# Patient Record
Sex: Female | Born: 1935 | Race: White | Hispanic: No | Smoking: Never smoker
Health system: Southern US, Community
[De-identification: ages and names within clinical notes are randomized; demographics above are authoritative.]

## PROBLEM LIST (undated history)

## (undated) DIAGNOSIS — G8929 Other chronic pain: Secondary | ICD-10-CM

## (undated) DIAGNOSIS — C519 Malignant neoplasm of vulva, unspecified: Secondary | ICD-10-CM

## (undated) DIAGNOSIS — G250 Essential tremor: Secondary | ICD-10-CM

## (undated) DIAGNOSIS — E785 Hyperlipidemia, unspecified: Secondary | ICD-10-CM

## (undated) DIAGNOSIS — M810 Age-related osteoporosis without current pathological fracture: Secondary | ICD-10-CM

## (undated) DIAGNOSIS — K219 Gastro-esophageal reflux disease without esophagitis: Secondary | ICD-10-CM

## (undated) DIAGNOSIS — M4850XA Collapsed vertebra, not elsewhere classified, site unspecified, initial encounter for fracture: Secondary | ICD-10-CM

## (undated) DIAGNOSIS — I82409 Acute embolism and thrombosis of unspecified deep veins of unspecified lower extremity: Secondary | ICD-10-CM

## (undated) DIAGNOSIS — M549 Dorsalgia, unspecified: Secondary | ICD-10-CM

## (undated) DIAGNOSIS — M1990 Unspecified osteoarthritis, unspecified site: Secondary | ICD-10-CM

## (undated) DIAGNOSIS — K922 Gastrointestinal hemorrhage, unspecified: Secondary | ICD-10-CM

## (undated) DIAGNOSIS — F0280 Dementia in other diseases classified elsewhere without behavioral disturbance: Secondary | ICD-10-CM

## (undated) DIAGNOSIS — I10 Essential (primary) hypertension: Secondary | ICD-10-CM

## (undated) DIAGNOSIS — F419 Anxiety disorder, unspecified: Secondary | ICD-10-CM

## (undated) DIAGNOSIS — J45909 Unspecified asthma, uncomplicated: Secondary | ICD-10-CM

## (undated) DIAGNOSIS — G309 Alzheimer's disease, unspecified: Secondary | ICD-10-CM

## (undated) DIAGNOSIS — N183 Chronic kidney disease, stage 3 (moderate): Secondary | ICD-10-CM

## (undated) HISTORY — DX: Unspecified asthma, uncomplicated: J45.909

## (undated) HISTORY — DX: Acute embolism and thrombosis of unspecified deep veins of unspecified lower extremity: I82.409

## (undated) HISTORY — DX: Alzheimer's disease, unspecified: G30.9

## (undated) HISTORY — DX: Age-related osteoporosis without current pathological fracture: M81.0

## (undated) HISTORY — PX: OTHER SURGICAL HISTORY: SHX169

## (undated) HISTORY — DX: Anxiety disorder, unspecified: F41.9

## (undated) HISTORY — DX: Unspecified osteoarthritis, unspecified site: M19.90

## (undated) HISTORY — DX: Gastro-esophageal reflux disease without esophagitis: K21.9

## (undated) HISTORY — DX: Dementia in other diseases classified elsewhere, unspecified severity, without behavioral disturbance, psychotic disturbance, mood disturbance, and anxiety: F02.80

## (undated) HISTORY — PX: NASAL SINUS SURGERY: SHX719

## (undated) HISTORY — DX: Collapsed vertebra, not elsewhere classified, site unspecified, initial encounter for fracture: M48.50XA

## (undated) HISTORY — DX: Malignant neoplasm of vulva, unspecified: C51.9

## (undated) HISTORY — DX: Chronic kidney disease, stage 3 (moderate): N18.3

## (undated) HISTORY — DX: Dorsalgia, unspecified: M54.9

## (undated) HISTORY — DX: Essential (primary) hypertension: I10

## (undated) HISTORY — DX: Essential tremor: G25.0

## (undated) HISTORY — DX: Hyperlipidemia, unspecified: E78.5

## (undated) HISTORY — DX: Other chronic pain: G89.29

## (undated) HISTORY — DX: Gastrointestinal hemorrhage, unspecified: K92.2

---

## 1965-08-08 HISTORY — PX: TOTAL ABDOMINAL HYSTERECTOMY W/ BILATERAL SALPINGOOPHORECTOMY: SHX83

## 1970-08-08 HISTORY — PX: OTHER SURGICAL HISTORY: SHX169

## 2002-08-08 HISTORY — PX: CATARACT EXTRACTION: SUR2

## 2007-09-03 LAB — PROTIME-INR

## 2007-10-05 ENCOUNTER — Ambulatory Visit: Admission: RE | Admit: 2007-10-05 | Discharge: 2007-10-05 | Payer: Self-pay | Admitting: Gynecology

## 2007-11-12 LAB — PROTIME-INR

## 2007-12-12 LAB — PROTIME-INR

## 2008-01-08 LAB — PROTIME-INR

## 2008-02-11 LAB — PROTIME-INR

## 2008-03-17 LAB — PROTIME-INR

## 2008-04-15 LAB — PROTIME-INR

## 2008-04-23 ENCOUNTER — Other Ambulatory Visit: Admission: RE | Admit: 2008-04-23 | Discharge: 2008-04-23 | Payer: Self-pay | Admitting: Gynecology

## 2008-04-23 ENCOUNTER — Ambulatory Visit: Admission: RE | Admit: 2008-04-23 | Discharge: 2008-04-23 | Payer: Self-pay | Admitting: Gynecology

## 2008-04-23 ENCOUNTER — Encounter: Payer: Self-pay | Admitting: Gynecology

## 2008-05-14 LAB — PROTIME-INR

## 2008-05-27 LAB — PROTIME-INR

## 2008-12-23 LAB — PROTIME-INR

## 2009-03-09 LAB — PROTIME-INR

## 2009-04-01 ENCOUNTER — Encounter (INDEPENDENT_AMBULATORY_CARE_PROVIDER_SITE_OTHER): Payer: Self-pay | Admitting: Gynecologic Oncology

## 2009-04-01 ENCOUNTER — Other Ambulatory Visit: Admission: RE | Admit: 2009-04-01 | Discharge: 2009-04-01 | Payer: Self-pay | Admitting: Gynecologic Oncology

## 2009-04-01 ENCOUNTER — Ambulatory Visit: Admission: RE | Admit: 2009-04-01 | Discharge: 2009-04-01 | Payer: Self-pay | Admitting: Gynecologic Oncology

## 2009-05-06 ENCOUNTER — Ambulatory Visit (HOSPITAL_BASED_OUTPATIENT_CLINIC_OR_DEPARTMENT_OTHER): Admission: RE | Admit: 2009-05-06 | Discharge: 2009-05-06 | Payer: Self-pay | Admitting: Gynecologic Oncology

## 2009-05-06 ENCOUNTER — Encounter (INDEPENDENT_AMBULATORY_CARE_PROVIDER_SITE_OTHER): Payer: Self-pay | Admitting: Gynecologic Oncology

## 2009-05-26 ENCOUNTER — Ambulatory Visit: Admission: RE | Admit: 2009-05-26 | Discharge: 2009-05-26 | Payer: Self-pay | Admitting: Gynecologic Oncology

## 2009-05-28 LAB — PROTIME-INR

## 2009-06-16 LAB — PROTIME-INR

## 2009-08-27 LAB — PROTIME-INR

## 2009-09-17 ENCOUNTER — Ambulatory Visit: Admission: RE | Admit: 2009-09-17 | Discharge: 2009-09-17 | Payer: Self-pay | Admitting: Gynecologic Oncology

## 2009-11-05 LAB — PROTIME-INR

## 2009-11-25 LAB — PROTIME-INR

## 2009-12-10 ENCOUNTER — Ambulatory Visit: Admission: RE | Admit: 2009-12-10 | Discharge: 2009-12-10 | Payer: Self-pay | Admitting: Gynecologic Oncology

## 2010-01-06 LAB — PROTIME-INR

## 2010-02-17 LAB — PROTIME-INR

## 2010-03-05 LAB — PROTIME-INR

## 2010-04-08 LAB — PROTIME-INR

## 2010-05-10 LAB — PROTIME-INR

## 2010-05-27 LAB — PROTIME-INR

## 2010-10-25 LAB — PROTIME-INR

## 2010-11-12 LAB — CBC
HCT: 45.4 % (ref 36.0–46.0)
MCHC: 33.9 g/dL (ref 30.0–36.0)
MCV: 93.7 fL (ref 78.0–100.0)
Platelets: 175 10*3/uL (ref 150–400)
RDW: 12.7 % (ref 11.5–15.5)

## 2010-11-12 LAB — PROTIME-INR: Prothrombin Time: 13.2 seconds (ref 11.6–15.2)

## 2010-11-12 LAB — DIFFERENTIAL
Basophils Absolute: 0.1 10*3/uL (ref 0.0–0.1)
Basophils Relative: 2 % — ABNORMAL HIGH (ref 0–1)
Eosinophils Absolute: 0.3 10*3/uL (ref 0.0–0.7)
Eosinophils Relative: 4 % (ref 0–5)
Lymphs Abs: 2.9 10*3/uL (ref 0.7–4.0)

## 2010-11-12 LAB — BASIC METABOLIC PANEL
BUN: 14 mg/dL (ref 6–23)
CO2: 30 mEq/L (ref 19–32)
Chloride: 101 mEq/L (ref 96–112)
Creatinine, Ser: 0.79 mg/dL (ref 0.4–1.2)
Glucose, Bld: 104 mg/dL — ABNORMAL HIGH (ref 70–99)
Potassium: 4.3 mEq/L (ref 3.5–5.1)

## 2010-11-29 LAB — PROTIME-INR: INR: 2.1 — AB (ref 0.9–1.1)

## 2010-12-21 NOTE — Consult Note (Signed)
Robin [name], Robin [name]             ACCOUNT NO.:  [id number]   MEDICAL RECORD NO.:  [id number]          PATIENT TYPE:  OUT   LOCATION:  GYN                          FACILITY:  [facility]   PHYSICIAN:  De Blanch, M.D.DATE OF BIRTH:  July 20, 1926   DATE OF CONSULTATION:  DATE OF DISCHARGE:                                 CONSULTATION   CHIEF COMPLAINT:  Vulvar cancer, left lower extremity edema.   INTERVAL HISTORY:  The patient returns today for continuing followup of  her vulvar cancer.  Since her last visit, she denies any problems with  her vulva.  She has no pelvic pain, pressure, vaginal bleeding or  discharge, GI or GU symptoms.  She has had mammograms recently.  These  were normal.   HISTORY OF PRESENT ILLNESS:  Stage II vulvar carcinoma, undergoing  initial surgery November 2008.  The procedure was complicated by a  postoperative lymphocyst and deep vein thrombosis.  The patient has been  on Coumadin for the last 9 months.   PAST MEDICAL HISTORY/ILLNESSES:  1. Hypertension.  2. Deep vein thrombosis.   CURRENT MEDICATIONS:  1. Lisinopril/hydrochlorothiazide.  2. Singulair.  3. Vytorin.  4. Coumadin 5.0 alternating with 2.5 mg daily.   DRUG ALLERGIES:  SULFA (rash.)   PAST SURGICAL HISTORY:  1. TAH-BSO for menorrhagia.  2. Appendectomy.  3. Hemithyroidectomy.  4. Breast biopsy.  5. Corneal implants.  6. Modified radical left vulvectomy and left inguinal lymphadenectomy.   OBSTETRICAL HISTORY:  Gravida 2.   SOCIAL HISTORY:  The patient is widowed.  She lives in assisted living  in her own apartment.  She comes accompanied by her daughter today.  She  does not smoke.   REVIEW OF SYSTEMS:  A 10-point comprehensive review of systems negative  except as noted above.   PHYSICAL EXAMINATION:  VITAL SIGNS:  Weight 124 pounds, blood pressure  139/58.  GENERAL:  The patient is a healthy, pleasant white female in no acute  distress.  HEENT:  Negative.  NECK:   Supple without thyromegaly.  BREASTS:  Without masses, discharge or skin changes.  She has a small  sebaceous cyst near the right nipple.  ABDOMEN:  Soft, nontender.  No mass, organomegaly, ascites or hernias  are noted.  Midline incision is well healed.  PELVIC:  EGBUS is normal.  No lesions are noted.  Vagina is clean,  slightly atrophic.  Again, no lesions are noted.  Bimanual rectovaginal  exam revealed no masses, induration or nodularity.  Cervix and uterus  are surgically absent.  LOWER EXTREMITIES:  The patient has 1+ ankle edema in the left ankle.   IMPRESSION:  1. Vulvar carcinoma November 2008.  No evidence of recurrent disease.      Pap smears were obtained.  2. Left lower extremity mild edema either secondary to lymphadenectomy      and/or deep vein thrombosis.  3. History of deep vein thrombosis.   The patient has a number of questions regarding continuing use of  Coumadin.  I indicated to her that in most cases, 6 months of Coumadin  would be the usual recommendation in  a patient who has been treated for,  but no longer has cancer.  She will discuss this with Dr. Shary Decamp.   FOLLOW UP:  She will return to see me in 6 months for continuing  followup.      De Blanch, M.D.  Electronically Signed     DC/MEDQ  D:  04/23/2008  T:  04/24/2008  Job:  [id number]   cc:   Feliciana Rossetti, MD  Fax: [phone]   Telford Nab, R.N.  [address]  [place], [place] [address]

## 2010-12-21 NOTE — Consult Note (Signed)
Robin [name]             ACCOUNT NO.:  [id number]   MEDICAL RECORD NO.:  [id number]          PATIENT TYPE:  OUT   LOCATION:  GYN                          FACILITY:  [facility]   PHYSICIAN:  De Blanch, M.D.DATE OF BIRTH:  1925/10/18   DATE OF CONSULTATION:  10/05/2007  DATE OF DISCHARGE:                                 CONSULTATION   CHIEF COMPLAINT:  Vulvar cancer.   HISTORY OF PRESENT ILLNESS:  A 77 year old white widowed female seen in  consultation at the request of Dr. Shary Decamp regarding management and  followup of a stage II vulvar carcinoma.   The patient was found to have a lesion on her left vulva in November  2008 which was biopsied and found to be an invasive well-differentiated  squamous cell carcinoma.  She underwent a modified left radical  vulvectomy and left inguinal lymphadenectomy on June 12, 2007.  Final  pathology showed no residual disease in the vulvar specimen and 9 lymph  nodes in the inguinal region which were free of any evidence of  metastatic disease.  The patient's postoperative course was complicated  by the development of a lymphocyst which was drained and an extensive  deep vein thrombosis involving the common iliac vein extending down to  the popliteal vein.  The patient is currently on Coumadin.   Currently, she denies any vulvar symptoms.  It is recognized that she  also has had lichen sclerosis, but does not have any itching or other  discomfort at the present time.   PAST MEDICAL HISTORY:  1. Hypertension.  2. Deep vein thrombosis.   CURRENT MEDICATIONS:  1. Lisinopril/hydrochlorothiazide.  2. Singulair.  3. Vytorin.  4. Coumadin.   ALLERGIES:  SULFA (rash).   PAST SURGICAL HISTORY:  1. Total abdominal surgery and bilateral salpingo-oophorectomy for      menorrhagia.  2. The patient thinks she had an appendectomy at the same time.  3. Hemithyroidectomy.  4. Breast biopsy.  5. Corneal implants.   OBSTETRICAL  HISTORY:  Gravida 2.   SOCIAL HISTORY:  The patient is widowed.  She lives in an assisted  living facility in her own apartment in [place].  She lives near her  adult daughter.  She does not smoke.   REVIEW OF SYSTEMS:  A 10-point comprehensive review of systems negative  except as noted above.   PHYSICAL EXAMINATION:  VITAL SIGNS:  Weight 125 pounds, height 5 feet 2  inches, blood pressure 110/80, pulse 80.  GENERAL:  The patient is a healthy elderly white female in no acute  distress.  HEENT:  Negative.  NECK:  Supple without thyromegaly.  There is no supraclavicular or  inguinal adenopathy in the left inguinal region.  EXTREMITIES:  There is minimal edema in the left thigh and none in the  left ankle and calf.  There is no evidence of a lymphocyst.  PELVIC/EGBUS:  Status post left hemivulvectomy, no lesions are noted.  She does have some white cutaneous changes on the right vulva consistent  with lichen sclerosis.  The vagina is atrophic.  Cervix and uterus are  surgically absent.  Bimanual  exam reveals no masses, induration or  nodularity.  Rectovaginal exam confirms.   IMPRESSION:  1. 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  The patient seems to be doing well with      no evidence of recurrent disease.  2. Deep vein thrombosis currently being managed on Coumadin by her      primary physician.   FOLLOW UP:  We will plan on seeing the patient back again in 6 months  for followup.      De Blanch, M.D.  Electronically Signed     DC/MEDQ  D:  10/05/2007  T:  10/06/2007  Job:  [id number]   cc:   Feliciana Rossetti, MD  Fax: [phone]   Telford Nab, R.N.  [address]  [place], [place] [address]

## 2010-12-21 NOTE — Consult Note (Signed)
[name], [name]             ACCOUNT NO.:  [id number]   MEDICAL RECORD NO.:  [id number]          PATIENT TYPE:  OUT   LOCATION:  GYN                          FACILITY:  [facility]   PHYSICIAN:  John T. Kyla Balzarine, M.D.    DATE OF BIRTH:  [birth year]   DATE OF CONSULTATION:  DATE OF DISCHARGE:                                 CONSULTATION   CHIEF COMPLAINT:  Follow-up of vulvar cancer with new vulvar lesion.   HISTORY OF PRESENT ILLNESS:  This patient had stage II vulvar cancer  treated in [place] in November, 2008.  She had postoperative lymphocyst  and DVT, treated on continuous Coumadin since.  She was last evaluated  here 1 year ago with negative examination and cytology.  In the interim,  she has urinary incontinence and over the summer has noted a new vulvar  pruritus and a new lesion in the right anterior vulva.  This is not  ulcerated, and she is concerned that this might represent recurrent  malignancy.  No new groin adenopathy.  Since she was last seen, she has  been diagnosed with some type of carotid obstruction, and amlodipine has  been added to her medical regimen.   PAST MEDICAL HISTORY:  Hypertension, postoperative DVT.  Prior TAH/BSO,  appendectomy, hemi-thyroidectomy, benign breast biopsy, corneal surgery,  and modified radical vulvectomy and left inguinal lymphadenectomy.  NSVD  x2.   MEDICATIONS:  Amlodipine, lisinopril and hydrochlorothiazide, Singulair,  Vytorin, Coumadin.   ALLERGIES:  SULFA drugs cause rash.   PERSONAL AND SOCIAL HISTORY:  The patient is widowed.  Nonsmoker.  Denies significant ethanol use.   FAMILY HISTORY:  Noncontributory.   REVIEW OF SYSTEMS:  A 10-point comprehensive review negative, except as  noted above.   PHYSICAL EXAMINATION:  Weight 124 pounds and blood pressure 124/86.  Vital signs otherwise stable and afebrile.  LUNGS:  Clear.  HEART:  Regular rate and rhythm with no JVD.  ABDOMEN:  Soft and benign with no hernia, ascites,  mass or tenderness.  EXTREMITIES:  No cords, Homans' or edema.  LYMPH SURVEY:  Negative for pathologic lymphadenopathy, including the  dissected left groin and the undissected right groin.  PELVIC:  Inspection of the vulva reveals a 2 cm area of erythema and  epithelial thickening involving the anterior right labia majora.  Vaginal mucosa is atrophic without lesions.  Bimanual and rectovaginal  examinations confirm absent uterus and cervix.  No mass or nodularity  and no tenderness.   PROCEDURE NOTE:  After informed consent obtained, the anterior vulvar  lesion was infiltrated with 1% Xylocaine and a punch biopsy obtained.  The operative site was treated with silver nitrate, and there were no  untoward side effects or bleeding.  It should be noted that an  appropriate time-out was observed prior to biopsy.   ASSESSMENT:  Vulvar cancer.  New vulvar lesion:  Differential diagnosis  by visual inspection includes chronic irritation with vulvar  hyperplasia, vulvar intraepithelial neoplasia; consider recurrent vulvar  cancer unlikely.   PLAN:  Disposition will depend upon results of biopsy.  This should be  communicated to her daughter,  Robin [name] at [phone].  If negative  for VIN, then the patient should be instructed in rinse/blot/blow dry  regimen again, and she should use clobetasol or 1% hydrocortisone cream.  The patient would need reversal of anticoagulation for resection if  there is any question of invasion, but could undergo with laser  vaporization of VIN III without reversal of her anticoagulation.      John T. Kyla Balzarine, M.D.  Electronically Signed     JTS/MEDQ  D:  04/01/2009  T:  04/01/2009  Job:  [id number]   cc:   Robin [name], R.N.  [address]  [place], [place] [address]   Robin Rossetti, MD  Fax: [phone]

## 2011-02-02 LAB — PROTIME-INR

## 2011-03-24 LAB — PROTIME-INR

## 2011-06-09 LAB — PROTIME-INR

## 2011-08-11 LAB — PROTIME-INR

## 2011-09-20 LAB — PROTIME-INR

## 2011-10-18 LAB — PROTIME-INR

## 2011-12-26 LAB — PROTIME-INR

## 2012-04-04 LAB — PROTIME-INR

## 2012-05-24 LAB — PROTIME-INR

## 2012-08-08 DIAGNOSIS — K922 Gastrointestinal hemorrhage, unspecified: Secondary | ICD-10-CM

## 2012-08-08 DIAGNOSIS — M4850XA Collapsed vertebra, not elsewhere classified, site unspecified, initial encounter for fracture: Secondary | ICD-10-CM

## 2012-08-08 HISTORY — DX: Gastrointestinal hemorrhage, unspecified: K92.2

## 2012-08-08 HISTORY — DX: Collapsed vertebra, not elsewhere classified, site unspecified, initial encounter for fracture: M48.50XA

## 2012-09-26 LAB — PROTIME-INR

## 2012-11-08 LAB — PROTIME-INR

## 2012-12-24 ENCOUNTER — Inpatient Hospital Stay: Payer: Self-pay | Admitting: Internal Medicine

## 2012-12-24 LAB — CBC
HGB: 14.1 g/dL (ref 12.0–16.0)
MCHC: 34.9 g/dL (ref 32.0–36.0)
MCV: 91 fL (ref 80–100)
Platelet: 452 10*3/uL — ABNORMAL HIGH (ref 150–440)
RDW: 12.9 % (ref 11.5–14.5)

## 2012-12-24 LAB — COMPREHENSIVE METABOLIC PANEL
Alkaline Phosphatase: 72 U/L (ref 50–136)
Anion Gap: 7 (ref 7–16)
Chloride: 95 mmol/L — ABNORMAL LOW (ref 98–107)
Co2: 28 mmol/L (ref 21–32)
Creatinine: 0.8 mg/dL (ref 0.60–1.30)
EGFR (African American): >60
Potassium: 3.6 mmol/L (ref 3.5–5.1)

## 2012-12-24 LAB — PROTIME-INR: INR: 1.9

## 2012-12-24 LAB — TROPONIN I: Troponin-I: <0.02 ng/mL

## 2012-12-25 LAB — CBC WITH DIFFERENTIAL/PLATELET
Basophil #: 0.1 10*3/uL (ref 0.0–0.1)
Basophil #: 0.1 10*3/uL (ref 0.0–0.1)
Basophil #: 0.1 10*3/uL (ref 0.0–0.1)
Basophil %: 0.7 %
Basophil %: 0.4 %
Basophil %: 0.5 %
Eosinophil #: 0.1 10*3/uL (ref 0.0–0.7)
Eosinophil #: 0.1 10*3/uL (ref 0.0–0.7)
Eosinophil %: 0.5 %
Eosinophil %: 1.2 %
HCT: 33.9 % — ABNORMAL LOW (ref 35.0–47.0)
HGB: 9.8 g/dL — ABNORMAL LOW (ref 12.0–16.0)
Lymphocyte #: 3.3 10*3/uL (ref 1.0–3.6)
Lymphocyte #: 2.3 10*3/uL (ref 1.0–3.6)
Lymphocyte #: 2.4 10*3/uL (ref 1.0–3.6)
Lymphocyte %: 15.2 %
MCH: 31.8 pg (ref 26.0–34.0)
MCH: 31.1 pg (ref 26.0–34.0)
MCHC: 33.9 g/dL (ref 32.0–36.0)
MCV: 92 fL (ref 80–100)
MCV: 92 fL (ref 80–100)
MCV: 92 fL (ref 80–100)
Monocyte #: 2.1 x10 3/mm — ABNORMAL HIGH (ref 0.2–0.9)
Monocyte #: 1.4 x10 3/mm — ABNORMAL HIGH (ref 0.2–0.9)
Monocyte #: 1.3 x10 3/mm — ABNORMAL HIGH (ref 0.2–0.9)
Monocyte %: 10.3 %
Monocyte %: 9.4 %
Neutrophil #: 11.1 10*3/uL — ABNORMAL HIGH (ref 1.4–6.5)
Neutrophil #: 9.2 10*3/uL — ABNORMAL HIGH (ref 1.4–6.5)
Neutrophil %: 74.5 %
Neutrophil %: 70.4 %
Platelet: 415 10*3/uL (ref 150–440)
Platelet: 265 10*3/uL (ref 150–440)
RBC: 3.39 10*6/uL — ABNORMAL LOW (ref 3.80–5.20)
RDW: 13 % (ref 11.5–14.5)
RDW: 12.9 % (ref 11.5–14.5)
WBC: 14.9 10*3/uL — ABNORMAL HIGH (ref 3.6–11.0)

## 2012-12-25 LAB — PROTIME-INR
INR: 2.3
Prothrombin Time: 24.4 secs — ABNORMAL HIGH (ref 11.5–14.7)

## 2012-12-26 LAB — BASIC METABOLIC PANEL
Calcium, Total: 8 mg/dL — ABNORMAL LOW (ref 8.5–10.1)
Creatinine: 0.61 mg/dL (ref 0.60–1.30)
EGFR (African American): >60
Osmolality: 269 (ref 275–301)
Potassium: 3.7 mmol/L (ref 3.5–5.1)

## 2012-12-26 LAB — CBC WITH DIFFERENTIAL/PLATELET
Basophil #: 0.1 10*3/uL (ref 0.0–0.1)
Eosinophil #: 0.2 10*3/uL (ref 0.0–0.7)
HGB: 10 g/dL — ABNORMAL LOW (ref 12.0–16.0)
Lymphocyte #: 2.1 10*3/uL (ref 1.0–3.6)
Lymphocyte %: 19.8 %
MCH: 31.7 pg (ref 26.0–34.0)
MCV: 91 fL (ref 80–100)
Monocyte #: 1.2 x10 3/mm — ABNORMAL HIGH (ref 0.2–0.9)
Monocyte %: 10.9 %
Neutrophil %: 66.8 %
RBC: 3.15 10*6/uL — ABNORMAL LOW (ref 3.80–5.20)
RDW: 12.8 % (ref 11.5–14.5)

## 2012-12-26 LAB — PROTIME-INR
INR: 1.5
Prothrombin Time: 17.9 secs — ABNORMAL HIGH (ref 11.5–14.7)

## 2012-12-27 LAB — CBC WITH DIFFERENTIAL/PLATELET
Basophil #: 0.1 10*3/uL (ref 0.0–0.1)
Basophil %: 0.6 %
Eosinophil #: 0 10*3/uL (ref 0.0–0.7)
Eosinophil %: 0.1 %
Lymphocyte #: 1.2 10*3/uL (ref 1.0–3.6)
Lymphocyte %: 11.8 %
MCHC: 34.7 g/dL (ref 32.0–36.0)
MCV: 90 fL (ref 80–100)
Monocyte #: 0.6 x10 3/mm (ref 0.2–0.9)
Monocyte %: 5.7 %
Neutrophil #: 8.4 10*3/uL — ABNORMAL HIGH (ref 1.4–6.5)
Neutrophil %: 81.8 %
Platelet: 287 10*3/uL (ref 150–440)
RBC: 3.19 10*6/uL — ABNORMAL LOW (ref 3.80–5.20)
RDW: 12.4 % (ref 11.5–14.5)
WBC: 10.2 10*3/uL (ref 3.6–11.0)

## 2012-12-27 LAB — PROTIME-INR: INR: 1.3

## 2012-12-28 ENCOUNTER — Other Ambulatory Visit: Payer: Self-pay | Admitting: *Deleted

## 2012-12-28 MED ORDER — OXYCODONE-ACETAMINOPHEN 5-325 MG PO TABS: ORAL_TABLET | ORAL | Status: DC

## 2012-12-28 MED ORDER — ALPRAZOLAM 0.25 MG PO TABS: ORAL_TABLET | ORAL | Status: DC

## 2012-12-29 LAB — CULTURE, BLOOD (SINGLE)

## 2013-01-07 ENCOUNTER — Encounter: Payer: Self-pay | Admitting: Internal Medicine

## 2013-01-07 ENCOUNTER — Non-Acute Institutional Stay (SKILLED_NURSING_FACILITY): Payer: Medicare Other | Admitting: Internal Medicine

## 2013-01-07 DIAGNOSIS — K209 Esophagitis, unspecified without bleeding: Secondary | ICD-10-CM

## 2013-01-07 DIAGNOSIS — IMO0002 Reserved for concepts with insufficient information to code with codable children: Secondary | ICD-10-CM

## 2013-01-07 DIAGNOSIS — G8929 Other chronic pain: Secondary | ICD-10-CM

## 2013-01-07 DIAGNOSIS — F411 Generalized anxiety disorder: Secondary | ICD-10-CM

## 2013-01-07 DIAGNOSIS — J4520 Mild intermittent asthma, uncomplicated: Secondary | ICD-10-CM

## 2013-01-07 DIAGNOSIS — J45909 Unspecified asthma, uncomplicated: Secondary | ICD-10-CM

## 2013-01-07 DIAGNOSIS — M549 Dorsalgia, unspecified: Secondary | ICD-10-CM

## 2013-01-07 NOTE — Progress Notes (Signed)
Patient ID: Robin [name], female   DOB: [birth year], 79 y.o.   MRN: [id number]   Code Status: DNR  Allergies  Allergen Reactions  . Latex   . Sulfa Antibiotics     Chief Complaint: New admit, post hospitalization 12/24/12- 12/29/12  HPI:  79 y/o female patient with hx of GERD and DVT among others was admitted with melena. She was on chronic prednsione and coumadin for her dvt. Her hb had dropped from 14.1 to 11.8 on admission. Ct abdomen/pelvis was done and showed no acute intraabdominal process.she was started on protonix drip and flagyl and GI was consulted. She underwent EGD which showed severe esophagitis. She was switched to oral ppi bid. Her hb remained stable. Her bp remained low in the hospital and her bp meds were held. She was also noted to have complression fracture of her spine  And has chronic back pain. Therapy team saw her and recommended SNF for short term rehab. She was seen in her room today.pleasant elderly female patient in no acute distress and denies any complaints. She has been working with therapy and feels she is getting stronger  Review of Systems  Constitutional: Negative for fever, chills, malaise/fatigue and diaphoresis.  HENT: Negative for congestion.   Eyes: Negative for blurred vision.  Respiratory: Negative for cough and shortness of breath.   Cardiovascular: Negative for chest pain and palpitations.  Gastrointestinal: Positive for heartburn. Negative for nausea, vomiting, abdominal pain and blood in stool.  Genitourinary: Negative for dysuria.  Musculoskeletal: Positive for back pain. Negative for myalgias.  Skin: Negative for rash.  Neurological: Negative for dizziness, tremors, weakness and headaches.  Psychiatric/Behavioral: Negative for depression.    Past Medical History  Diagnosis Date  . Asthma   . GERD (gastroesophageal reflux disease)   . DVT (deep venous thrombosis)   . Osteoporosis   . Vulvar cancer   . Chronic back pain    Past  Surgical History  Procedure Laterality Date  . Abdominal hysterectomy    . Thyroid surgery     Social History:   reports that she has never smoked. She does not have any smokeless tobacco history on file. She reports that she does not drink alcohol or use illicit drugs.  Family History  Problem Relation Age of Onset  . Heart disease Mother     chf  . Cancer Father     colon cancer  . Cancer Brother     renal cancer    Medications: Patient's Medications  New Prescriptions   No medications on file  Previous Medications   ACETAMINOPHEN (TYLENOL) 500 MG TABLET    Take 500 mg by mouth daily as needed for pain or fever.   ALPRAZOLAM (XANAX) 0.25 MG TABLET    Take 1 tablet every 8 hours as needed for anxiety   CALCIUM CARBONATE-VITAMIN D (CALCIUM-VITAMIN D) 600-200 MG-UNIT CAPS    Take 1 capsule by mouth 3 (three) times daily.   MONTELUKAST (SINGULAIR) 10 MG TABLET    Take 10 mg by mouth at bedtime.   OXYCODONE-ACETAMINOPHEN (PERCOCET/ROXICET) 5-325 MG PER TABLET    Take one tablet  Every 6 hours as needed for pain   PANTOPRAZOLE (PROTONIX) 40 MG TABLET    Take 40 mg by mouth 2 (two) times daily.  Modified Medications   No medications on file  Discontinued Medications   No medications on file    Physical Exam:  Filed Vitals:   01/07/13 1259  BP: 117/67  Pulse: 84  Temp: 98.2 F (36.8 C)  Resp: 16  Height: 5\' 5"  (1.651 m)  Weight: 143 lb (64.864 kg)  SpO2: 96%   gen- elderly patient , pleasant, in no acute distress heent- no pallor or icterus, no LAD, MMM cvs- normal s1, s2, rrr respi- b/l cta, no wheeze or rhonchi abdo- bowel sounds present, soft, non tender, no abdominal wall tenderness Ext- able to move all 4, adequate strength Neuro- aaox 3, non focal  Labs reviewed: Facility lab pending  Assessment/Plan  Esophagitis- continue protonix 40 mg bid, no melena or blood in stool reported.  Tolerating feed well.   Generalized weakness- in setting of recent bleed.  Working well with therapy team. Once has steady gait and adequate strength, will be able to return to her familiar setting  Asthma- currently stable on montelukast , monitor clinically.   Anxiety- symptoms under control with xanax   Osteoporosis- on ca-vit d, fall precautions  Chronic back pain- stable on current pain regimen and ca-vit d supplement, fall precautions. Monitor clinically  Of note- on reviewof discharge packet, pt has been on prednisone 5 mg daily chronically. Pt unable to provide a reason. Will continue her home course of prednisone for now but given her esophagitis and osteoporosis hisotry, long term steroid use can worsen them. Will have pcp re-evaulate need for prednisone.   Family/ staff Communication: reviewed care plan with patient and nursing supervisor   Goals of care: completion of STR and return home   Labs/tests ordered- cbc, cmp

## 2013-01-10 DIAGNOSIS — J45909 Unspecified asthma, uncomplicated: Secondary | ICD-10-CM | POA: Insufficient documentation

## 2013-01-10 DIAGNOSIS — IMO0002 Reserved for concepts with insufficient information to code with codable children: Secondary | ICD-10-CM | POA: Insufficient documentation

## 2013-01-10 DIAGNOSIS — F411 Generalized anxiety disorder: Secondary | ICD-10-CM | POA: Insufficient documentation

## 2013-01-10 DIAGNOSIS — M549 Dorsalgia, unspecified: Secondary | ICD-10-CM | POA: Insufficient documentation

## 2013-01-23 ENCOUNTER — Non-Acute Institutional Stay (SKILLED_NURSING_FACILITY): Payer: Medicare Other | Admitting: Nurse Practitioner

## 2013-01-23 ENCOUNTER — Encounter: Payer: Self-pay | Admitting: Nurse Practitioner

## 2013-01-23 DIAGNOSIS — IMO0002 Reserved for concepts with insufficient information to code with codable children: Secondary | ICD-10-CM

## 2013-01-23 DIAGNOSIS — R5381 Other malaise: Secondary | ICD-10-CM

## 2013-01-23 MED ORDER — VITAMIN D 50 MCG (2000 UT) PO TABS: 2000.0000 [IU] | ORAL_TABLET | Freq: Every day | ORAL | Status: DC

## 2013-01-23 NOTE — Progress Notes (Signed)
  Subjective:    Patient ID: Robin [name], female    DOB: 12-23-1925, 79 y.o.   MRN: [id number]  HPI Comments: Pt was seen today for discharge from snf.     Review of Systems  All other systems reviewed and are negative.       Objective:   Physical Exam  Vitals reviewed. Constitutional: She appears well-developed and well-nourished.  Eyes: Pupils are equal, round, and reactive to light.  Cardiovascular: Normal rate and regular rhythm.   Pulmonary/Chest: Effort normal and breath sounds normal.  Abdominal: Soft.  Neurological: She is alert.  Skin: Skin is warm and dry.  Psychiatric: She has a normal mood and affect.          Assessment & Plan:  Discharge home with home health pt/ot; home safety eval. DME: rollator. Will d/c Percocet due to non-use, she can continue to use Tylenol prn. RX's written and given to pt. Daughter says pt does not need any of the other meds after review. 1) Xanax 0.25mg  one po q 8 hrs prn #90 2) Singulair 10mg  po qpm #30 3) Vitamin d3 2000 u po qd #30 Pt is to make appointment with pcp in 1-2 weeks. She is to follow up with ortho as directed.

## 2013-01-27 DIAGNOSIS — Z5189 Encounter for other specified aftercare: Secondary | ICD-10-CM

## 2013-01-27 DIAGNOSIS — J45909 Unspecified asthma, uncomplicated: Secondary | ICD-10-CM

## 2013-01-27 DIAGNOSIS — M6281 Muscle weakness (generalized): Secondary | ICD-10-CM

## 2013-01-27 DIAGNOSIS — R262 Difficulty in walking, not elsewhere classified: Secondary | ICD-10-CM

## 2013-02-21 ENCOUNTER — Ambulatory Visit: Payer: Self-pay | Admitting: Gastroenterology

## 2013-03-27 ENCOUNTER — Encounter: Payer: Self-pay | Admitting: Family Medicine

## 2013-03-27 ENCOUNTER — Telehealth: Payer: Self-pay

## 2013-03-27 ENCOUNTER — Ambulatory Visit (INDEPENDENT_AMBULATORY_CARE_PROVIDER_SITE_OTHER): Payer: Medicare Other | Admitting: Family Medicine

## 2013-03-27 VITALS — BP 130/70 | HR 84 | Temp 98.2°F | Ht 59.5 in | Wt 120.0 lb

## 2013-03-27 DIAGNOSIS — F419 Anxiety disorder, unspecified: Secondary | ICD-10-CM

## 2013-03-27 DIAGNOSIS — N390 Urinary tract infection, site not specified: Secondary | ICD-10-CM

## 2013-03-27 DIAGNOSIS — E785 Hyperlipidemia, unspecified: Secondary | ICD-10-CM

## 2013-03-27 DIAGNOSIS — C519 Malignant neoplasm of vulva, unspecified: Secondary | ICD-10-CM

## 2013-03-27 DIAGNOSIS — F0280 Dementia in other diseases classified elsewhere without behavioral disturbance: Secondary | ICD-10-CM

## 2013-03-27 DIAGNOSIS — F411 Generalized anxiety disorder: Secondary | ICD-10-CM

## 2013-03-27 DIAGNOSIS — M8448XD Pathological fracture, other site, subsequent encounter for fracture with routine healing: Secondary | ICD-10-CM

## 2013-03-27 DIAGNOSIS — K922 Gastrointestinal hemorrhage, unspecified: Secondary | ICD-10-CM

## 2013-03-27 DIAGNOSIS — M1990 Unspecified osteoarthritis, unspecified site: Secondary | ICD-10-CM

## 2013-03-27 LAB — POCT URINALYSIS DIPSTICK
Glucose, UA: NEGATIVE
Nitrite, UA: NEGATIVE
Urobilinogen, UA: 0.2

## 2013-03-27 MED ORDER — PREDNISONE 5 MG PO TABS: 5.0000 mg | ORAL_TABLET | Freq: Every day | ORAL | Status: DC

## 2013-03-27 MED ORDER — FLUOXETINE HCL 10 MG PO CAPS: 10.0000 mg | ORAL_CAPSULE | Freq: Every day | ORAL | Status: DC

## 2013-03-27 MED ORDER — PANTOPRAZOLE SODIUM 40 MG PO TBEC: 40.0000 mg | DELAYED_RELEASE_TABLET | Freq: Two times a day (BID) | ORAL | Status: DC

## 2013-03-27 MED ORDER — VITAMIN D 50 MCG (2000 UT) PO TABS: 2000.0000 [IU] | ORAL_TABLET | Freq: Every day | ORAL | Status: DC

## 2013-03-27 MED ORDER — MONTELUKAST SODIUM 10 MG PO TABS: 10.0000 mg | ORAL_TABLET | Freq: Every day | ORAL | Status: DC

## 2013-03-27 NOTE — Patient Instructions (Addendum)
F/u 4 months 

## 2013-03-27 NOTE — Progress Notes (Signed)
Nature conservation officer at [facility] [address] [place] [address] Phone: [phone] Fax: [phone]  Date:  03/27/2013   Name:  Robin [name]   DOB:  Oct 13, 1925   MRN:  [id number] Gender: female Age: 79 y.o.  Primary Physician:  No primary provider on file.  Evaluating MD: Hannah Beat, MD   Chief Complaint: Establish Care   History of Present Illness:  Robin [name] is a 79 y.o. pleasant patient who presents with the following:  New patient: Complex 80 year old patient recently discharged from hospital at [facility] with GI bleed while on Coumadin, s/p 2 endoscopies. Lives with her daughter, and a significant history of vulvar cancer as well.  DVT 2 weeks out from original surgery, then placed for coumadin. Bleeding internally, then placed on Coumaddin. Daughter [place] [address]. After they stopped her coumadin given gi bleed risk and her recent falls and dementia.  Now off BP meds and severe OA, osteoporosis. (5 mg pred x 3 years) Bleeding --- was on protonix 40 mg. Currently she has having some significant reflux.  On pred chronically.  Sometimes she has quite a bit of anxiety and has been taking some anxiety intermittently.  She also has some early alzheimer's disease.  Patient Active Problem List   Diagnosis Date Noted  . Vulvar cancer   . Osteoarthritis   . Hyperlipidemia   . Anxiety   . Alzheimers disease   . Compression fracture of spine   . GI bleed   . Chronic steroid use 01/10/2013  . Asthma, chronic 01/10/2013  . Chronic back pain 01/10/2013  . Anxiety state, unspecified 01/10/2013  . Acute esophagitis 01/10/2013    Past Medical History  Diagnosis Date  . Asthma   . GERD (gastroesophageal reflux disease)   . DVT (deep venous thrombosis)   . Osteoporosis   . Vulvar cancer   . Chronic back pain   . Osteoarthritis   . Hypertension   . Hyperlipidemia   . Anxiety   . Alzheimers disease     mild  . Compression  fracture of spine 2014    x3  . GI bleed 2014    ICU stay at [facility] x 4 days    Past Surgical History  Procedure Laterality Date  . Abdominal hysterectomy  1967    [place]  . Thyroid surgery  1972    removed half, [place]  . Nasal sinus surgery  1977.,79 ,97 and 98    removed polyps, [place]  . Cataract extraction  2004  . Vulvur cancer and lymph nodes removed  2008 and 2010    2nd Cancer surgery at [facility]    History   Social History  . Marital Status: Widowed    Spouse Name: N/A    Number of Children: N/A  . Years of Education: 12   Occupational History  . retired    Social History Main Topics  . Smoking status: Never Smoker   . Smokeless tobacco: Never Used  . Alcohol Use: No  . Drug Use: No  . Sexual Activity: Not Currently   Other Topics Concern  . Not on file   Social History Narrative   Had been living in [place]   Living with daughter in [place] now    Family History  Problem Relation Age of Onset  . Heart disease Mother     chf  . Cancer Father     colon cancer  . Cancer Brother  renal cancer    Allergies  Allergen Reactions  . Latex   . Sulfa Antibiotics     Current Outpatient Prescriptions on File Prior to Visit  Medication Sig Dispense Refill  . acetaminophen (TYLENOL) 500 MG tablet Take 500 mg by mouth daily as needed for pain or fever.      . Calcium Carbonate-Vitamin D (CALCIUM-VITAMIN D) 600-200 MG-UNIT CAPS Take 1 capsule by mouth 3 (three) times daily.       No current facility-administered medications on file prior to visit.     Review of Systems:  Gi bleed as above. No cp, no lightheadedness. She has had some dark urine, but had a normal urological work-up this year, including cystoscopy. Asthma is stable.  Otherwise, the pertinent positives and negatives are listed above and in the HPI, otherwise a full review of systems has been reviewed and is negative unless noted positive.   Physical Examination: BP 130/70   Pulse 84  Temp(Src) 98.2 F (36.8 C) (Oral)  Ht 4' 11.5" (1.511 m)  Wt 120 lb (54.432 kg)  BMI 23.84 kg/m2  Ideal Body Weight: Weight in (lb) to have BMI = 25: 125.6   GEN: WDWN, NAD, Non-toxic, A & O x 3 HEENT: Atraumatic, Normocephalic. Neck supple. No masses, No LAD. Ears and Nose: No external deformity. CV: RRR, No M/G/R. No JVD. No thrill. No extra heart sounds. PULM: CTA B, no wheezes, crackles, rhonchi. No retractions. No resp. distress. No accessory muscle use. EXTR: No c/c/e NEURO Normal gait.  PSYCH: Normally interactive. Conversant. Not depressed or anxious appearing.  Calm demeanor.    Assessment and Plan:  UTI (urinary tract infection) - Plan: Urine Microscopic, POCT urinalysis dipstick  Vulvar cancer  Osteoarthritis  Hyperlipidemia  Anxiety  Alzheimers disease  Compression fracture of spine, with routine healing, subsequent encounter  GI bleed  >45 minutes spent in face to face time with patient, >50% spent in counselling or coordination of care: long conversation with patient and daughter going over history, review of records. I think staying off coumadin is wise, as she is falling, almost died from a GI bleed, and has dementia.  I am also going to try to very slowly taper her off of prednisone over about 6 months. Potential risks would seem to outweigh risks.  Results for orders placed in visit on 03/27/13  URINALYSIS, MICROSCOPIC ONLY      Result Value Range   Squamous Epithelial / LPF NONE SEEN  RARE   Crystals NONE SEEN  NONE SEEN   Casts NONE SEEN  NONE SEEN   WBC, UA 0-2  <3 WBC/hpf   RBC / HPF 0-2  <3 RBC/hpf   Bacteria, UA NONE SEEN  RARE  POCT URINALYSIS DIPSTICK      Result Value Range   Color, UA yellow     Clarity, UA cloudy     Glucose, UA negative     Bilirubin, UA negative     Ketones, UA negative     Spec Grav, UA 1.010     Blood, UA moderate     pH, UA 7.5     Protein, UA trace     Urobilinogen, UA 0.2     Nitrite, UA  negative     Leukocytes, UA Trace       Orders Today:  Orders Placed This Encounter  Procedures  . Urine Microscopic  . POCT urinalysis dipstick    Updated Medication List: (Includes new medications, updates to list, dose adjustments)  Meds ordered this encounter  Medications  . DISCONTD: prednisoLONE 5 MG TABS tablet    Sig: Take by mouth daily.  . Cholecalciferol (VITAMIN D) 2000 UNITS tablet    Sig: Take 1 tablet (2,000 Units total) by mouth daily.    Dispense:  30 tablet    Refill:  11  . pantoprazole (PROTONIX) 40 MG tablet    Sig: Take 1 tablet (40 mg total) by mouth 2 (two) times daily.    Dispense:  30 tablet    Refill:  11  . montelukast (SINGULAIR) 10 MG tablet    Sig: Take 1 tablet (10 mg total) by mouth at bedtime.    Dispense:  30 tablet    Refill:  11  . predniSONE (DELTASONE) 5 MG tablet    Sig: Take 1 tablet (5 mg total) by mouth daily.    Dispense:  30 tablet    Refill:  5  . FLUoxetine (PROZAC) 10 MG capsule    Sig: Take 1 capsule (10 mg total) by mouth daily.    Dispense:  30 capsule    Refill:  5    Medications Discontinued: Medications Discontinued During This Encounter  Medication Reason  . ALPRAZolam (XANAX) 0.25 MG tablet   . prednisoLONE 5 MG TABS tablet   . Cholecalciferol (VITAMIN D) 2000 UNITS tablet Reorder  . pantoprazole (PROTONIX) 40 MG tablet Reorder  . montelukast (SINGULAIR) 10 MG tablet Reorder      Signed, Karleen Hampshire T. Gurdeep Keesey, MD 03/27/2013 2:22 PM

## 2013-03-27 NOTE — Telephone Encounter (Signed)
Heidi with [facility] left v/m; prednisone 5 mg taking one tab daily [facility]; pts family request clarification of instructions; family thought pt was to wein off of prednisone and the dosage was to be decreased.Please advise.

## 2013-03-28 LAB — URINALYSIS, MICROSCOPIC ONLY
Bacteria, UA: NONE SEEN
Crystals: NONE SEEN

## 2013-03-28 NOTE — Telephone Encounter (Signed)
Advised Heidi at [facility] of directions.

## 2013-03-28 NOTE — Telephone Encounter (Signed)
Decrease down to 4 mg daily. 1 mg tablets. 4 tabs po daily. #120. 0 refills   On the next refill: 1 mg tablets, 3 tablets po daily. #90, 0 refills

## 2013-03-31 ENCOUNTER — Encounter: Payer: Self-pay | Admitting: Family Medicine

## 2013-03-31 DIAGNOSIS — K922 Gastrointestinal hemorrhage, unspecified: Secondary | ICD-10-CM | POA: Insufficient documentation

## 2013-03-31 DIAGNOSIS — F419 Anxiety disorder, unspecified: Secondary | ICD-10-CM | POA: Insufficient documentation

## 2013-03-31 DIAGNOSIS — F0280 Dementia in other diseases classified elsewhere without behavioral disturbance: Secondary | ICD-10-CM | POA: Insufficient documentation

## 2013-03-31 DIAGNOSIS — M1990 Unspecified osteoarthritis, unspecified site: Secondary | ICD-10-CM | POA: Insufficient documentation

## 2013-03-31 DIAGNOSIS — M4850XA Collapsed vertebra, not elsewhere classified, site unspecified, initial encounter for fracture: Secondary | ICD-10-CM | POA: Insufficient documentation

## 2013-03-31 DIAGNOSIS — E785 Hyperlipidemia, unspecified: Secondary | ICD-10-CM | POA: Insufficient documentation

## 2013-03-31 DIAGNOSIS — C519 Malignant neoplasm of vulva, unspecified: Secondary | ICD-10-CM | POA: Insufficient documentation

## 2013-04-01 ENCOUNTER — Other Ambulatory Visit: Payer: Self-pay | Admitting: Family Medicine

## 2013-04-01 NOTE — Telephone Encounter (Signed)
Pt's daughter called stating that pt was switched from xanax to prozac last week and today she is crying, shaking, and panicking.  She would like to know if pt can switch back to xanax.

## 2013-04-01 NOTE — Telephone Encounter (Signed)
She can still take her xanax tid prn --- it sounds like she needs them now, and it will take a month or so for the prozac to work

## 2013-04-01 NOTE — Telephone Encounter (Signed)
Please send in #60, 2 refills

## 2013-04-01 NOTE — Telephone Encounter (Signed)
Patient's daughter notified as instructed by telephone. Was advised that she is almost out of the Xanax and would like [facility]

## 2013-04-02 MED ORDER — ALPRAZOLAM 0.25 MG PO TABS: 0.2500 mg | ORAL_TABLET | Freq: Three times a day (TID) | ORAL | Status: DC | PRN

## 2013-04-02 NOTE — Telephone Encounter (Signed)
Rx called to pharmacy

## 2013-05-13 ENCOUNTER — Telehealth: Payer: Self-pay | Admitting: *Deleted

## 2013-05-13 NOTE — Telephone Encounter (Signed)
[place] Sink notified as instructed by telephone.  She is in agreement with plan.

## 2013-05-13 NOTE — Telephone Encounter (Signed)
Received a voicemail from [place] stating her mom has been on steroids 5 mg x 2 years for osteoarthritis prescribed by another physician.  States Dr. Patsy Lager has been trying to ween patient off the steroids and had gotten her down to 3 mg daily but her pain is beginning to come back.  [place] Sink is asking if Dr. Patsy Lager will keep her mom on 3 mg daily and even considering increasing it back to 4 mg daily.  Will forward to Dr. Patsy Lager for review.  Call back number [phone].

## 2013-05-13 NOTE — Telephone Encounter (Signed)
We can keep her on 3 mg of prednisone. I would keep that up for at least 1-2 months, but if she is very uncomfortable, then we can put her on 4 mg.   Hannah Beat, MD 05/13/2013, 11:25 AM

## 2013-05-20 ENCOUNTER — Other Ambulatory Visit: Payer: Self-pay | Admitting: Family Medicine

## 2013-06-23 ENCOUNTER — Other Ambulatory Visit: Payer: Self-pay | Admitting: Family Medicine

## 2013-06-23 NOTE — Telephone Encounter (Signed)
Last office visit 03/27/2013.  Ok to refill? 

## 2013-07-29 ENCOUNTER — Encounter: Payer: Self-pay | Admitting: Family Medicine

## 2013-07-29 ENCOUNTER — Ambulatory Visit (INDEPENDENT_AMBULATORY_CARE_PROVIDER_SITE_OTHER): Payer: Medicare Other | Admitting: Family Medicine

## 2013-07-29 VITALS — BP 176/102 | HR 72 | Temp 97.8°F | Ht 59.5 in | Wt 122.8 lb

## 2013-07-29 DIAGNOSIS — F0280 Dementia in other diseases classified elsewhere without behavioral disturbance: Secondary | ICD-10-CM

## 2013-07-29 DIAGNOSIS — I10 Essential (primary) hypertension: Secondary | ICD-10-CM

## 2013-07-29 DIAGNOSIS — E785 Hyperlipidemia, unspecified: Secondary | ICD-10-CM

## 2013-07-29 DIAGNOSIS — K922 Gastrointestinal hemorrhage, unspecified: Secondary | ICD-10-CM

## 2013-07-29 DIAGNOSIS — Z79899 Other long term (current) drug therapy: Secondary | ICD-10-CM

## 2013-07-29 DIAGNOSIS — C519 Malignant neoplasm of vulva, unspecified: Secondary | ICD-10-CM

## 2013-07-29 DIAGNOSIS — F411 Generalized anxiety disorder: Secondary | ICD-10-CM

## 2013-07-29 DIAGNOSIS — IMO0002 Reserved for concepts with insufficient information to code with codable children: Secondary | ICD-10-CM

## 2013-07-29 DIAGNOSIS — N183 Chronic kidney disease, stage 3 unspecified: Secondary | ICD-10-CM

## 2013-07-29 DIAGNOSIS — G8929 Other chronic pain: Secondary | ICD-10-CM

## 2013-07-29 DIAGNOSIS — M549 Dorsalgia, unspecified: Secondary | ICD-10-CM

## 2013-07-29 HISTORY — DX: Chronic kidney disease, stage 3 unspecified: N18.30

## 2013-07-29 LAB — HEPATIC FUNCTION PANEL
Albumin: 3.9 g/dL (ref 3.5–5.2)
Alkaline Phosphatase: 76 U/L (ref 39–117)
Total Protein: 7 g/dL (ref 6.0–8.3)

## 2013-07-29 LAB — CBC WITH DIFFERENTIAL/PLATELET
Basophils Absolute: 0 10*3/uL (ref 0.0–0.1)
Basophils Relative: 0.5 % (ref 0.0–3.0)
Eosinophils Absolute: 0.1 10*3/uL (ref 0.0–0.7)
HCT: 42.3 % (ref 36.0–46.0)
Hemoglobin: 13.9 g/dL (ref 12.0–15.0)
Lymphocytes Relative: 29 % (ref 12.0–46.0)
Lymphs Abs: 2.7 10*3/uL (ref 0.7–4.0)
MCHC: 32.8 g/dL (ref 30.0–36.0)
MCV: 85.5 fl (ref 78.0–100.0)
Monocytes Absolute: 0.8 10*3/uL (ref 0.1–1.0)
Neutro Abs: 5.7 10*3/uL (ref 1.4–7.7)
RBC: 4.94 Mil/uL (ref 3.87–5.11)
RDW: 14.9 % — ABNORMAL HIGH (ref 11.5–14.6)

## 2013-07-29 LAB — BASIC METABOLIC PANEL
BUN: 16 mg/dL (ref 6–23)
Calcium: 9.4 mg/dL (ref 8.4–10.5)
Potassium: 3.8 mEq/L (ref 3.5–5.1)

## 2013-07-29 MED ORDER — LISINOPRIL 20 MG PO TABS: 20.0000 mg | ORAL_TABLET | Freq: Every day | ORAL | Status: DC

## 2013-07-29 NOTE — Patient Instructions (Signed)
F/u 4-5 weeks

## 2013-07-29 NOTE — Progress Notes (Signed)
Pre-visit discussion using our clinic review tool. No additional management support is needed unless otherwise documented below in the visit note.  

## 2013-07-29 NOTE — Progress Notes (Signed)
Date:  07/29/2013   Name:  Robin [name]   DOB:  [birth year]   MRN:  [id number] Gender: female Age: 79 y.o.  Primary Physician:  Hannah Beat, MD   Chief Complaint: Follow-up   Subjective:   History of Present Illness:  Robin [name] is a 79 y.o. pleasant patient who presents with the following:  D/p GI bleed, d/c all HTN meds and now is hypertensive in the office. Was on lisinopril and HCTZ before. Now on nothing. 165/95  Add back lisinopril  Stooped over a few weeks ago, having more pain now.   Chronic pain, OA, Chronic steroid use: I tried to titrate down her prednisone dosing, and she could not tolerate going below 3 mg a day, so we have continued this and added prn tylenol.  Back pain as above, too. Still walking fairly well- walker when outside.  Patient Active Problem List   Diagnosis Date Noted  . Hypertension 07/29/2013  . Vulvar cancer   . Osteoarthritis   . Hyperlipidemia   . Anxiety   . Alzheimers disease   . Compression fracture of spine   . GI bleed   . Chronic steroid use 01/10/2013  . Asthma, chronic 01/10/2013  . Chronic back pain 01/10/2013  . Anxiety state, unspecified 01/10/2013  . Acute esophagitis 01/10/2013    Past Medical History  Diagnosis Date  . Asthma   . GERD (gastroesophageal reflux disease)   . DVT (deep venous thrombosis)   . Osteoporosis   . Vulvar cancer   . Chronic back pain   . Osteoarthritis   . Hypertension   . Hyperlipidemia   . Anxiety   . Alzheimers disease     mild  . Compression fracture of spine 2014    x3  . GI bleed 2014    ICU stay at [facility] x 4 days    Past Surgical History  Procedure Laterality Date  . Abdominal hysterectomy  1967    [place]  . Thyroid surgery  1972    removed half, [place]  . Nasal sinus surgery  1977.,79 ,97 and 98    removed polyps, [place]  . Cataract extraction  2004  . Vulvur cancer and lymph nodes removed  2008 and 2010    2nd Cancer surgery at [facility]     History   Social History  . Marital Status: Widowed    Spouse Name: N/A    Number of Children: N/A  . Years of Education: 12   Occupational History  . retired    Social History Main Topics  . Smoking status: Never Smoker   . Smokeless tobacco: Never Used  . Alcohol Use: No  . Drug Use: No  . Sexual Activity: Not Currently   Other Topics Concern  . Not on file   Social History Narrative   Had been living in [place]   Living with daughter in [place] now    Family History  Problem Relation Age of Onset  . Heart disease Mother     chf  . Cancer Father     colon cancer  . Cancer Brother     renal cancer    Allergies  Allergen Reactions  . Latex   . Sulfa Antibiotics     Medication list has been reviewed and updated.  Review of Systems:   GEN: No acute illnesses, no fevers, chills. GI: No n/v/d, eating normally Pulm: No SOB Interactive and getting along well at home.  Otherwise, ROS is  as per the HPI.  Objective:   Physical Examination: BP 176/102  Pulse 72  Temp(Src) 97.8 F (36.6 C) (Oral)  Ht 4' 11.5" (1.511 m)  Wt 122 lb 12 oz (55.679 kg)  BMI 24.39 kg/m2  Ideal Body Weight: Weight in (lb) to have BMI = 25: 125.6   GEN: WDWN, NAD, Non-toxic, A & O x 3 HEENT: Atraumatic, Normocephalic. Neck supple. No masses, No LAD. Ears and Nose: No external deformity. CV: RRR, No M/G/R. No JVD. No thrill. No extra heart sounds. PULM: CTA B, no wheezes, crackles, rhonchi. No retractions. No resp. distress. No accessory muscle use. EXTR: No c/c/e NEURO Normal gait.  PSYCH: Normally interactive. Conversant. Not depressed or anxious appearing.  Calm demeanor.   Laboratory and Imaging Data:  Assessment & Plan:    Chronic steroid use  Hyperlipidemia  Anxiety  GI bleed - Plan: CBC with Differential  Encounter for long-term (current) use of other medications - Plan: Basic metabolic panel, CBC with Differential, Hepatic function panel  Chronic  back pain  Vulvar cancer  Alzheimers disease  Hypertension  Add ace, recheck labs  Patient Instructions  F/u 4-5 weeks   Orders Today:  Orders Placed This Encounter  Procedures  . Basic metabolic panel  . CBC with Differential  . Hepatic function panel    New medications, updates to list, dose adjustments: Meds ordered this encounter  Medications  . ALPRAZolam (XANAX) 0.25 MG tablet    Sig: Take 0.25 mg by mouth at bedtime as needed.  Marland Kitchen lisinopril (PRINIVIL,ZESTRIL) 20 MG tablet    Sig: Take 1 tablet (20 mg total) by mouth daily.    Dispense:  30 tablet    Refill:  4    Signed,  Porfirio Bollier T. Ziyad Dyar, MD, CAQ Sports Medicine  [facility] [address] [place] [address] Phone: [phone] Fax: [phone]  Updated Complete Medication List:   Medication List       This list is accurate as of: 07/29/13 10:39 AM.  Always use your most recent med list.               acetaminophen 500 MG tablet  Commonly known as:  TYLENOL  Take 500 mg by mouth daily as needed for pain or fever.     ALPRAZolam 0.25 MG tablet  Commonly known as:  XANAX  Take 0.25 mg by mouth at bedtime as needed.     Calcium-Vitamin D 600-200 MG-UNIT Caps  Take 1 capsule by mouth 3 (three) times daily.     FLUoxetine 10 MG capsule  Commonly known as:  PROZAC  Take 1 capsule (10 mg total) by mouth daily.     lisinopril 20 MG tablet  Commonly known as:  PRINIVIL,ZESTRIL  Take 1 tablet (20 mg total) by mouth daily.     montelukast 10 MG tablet  Commonly known as:  SINGULAIR  Take 1 tablet (10 mg total) by mouth at bedtime.     pantoprazole 40 MG tablet  Commonly known as:  PROTONIX  Take 1 tablet (40 mg total) by mouth 2 (two) times daily.     predniSONE 1 MG tablet  Commonly known as:  DELTASONE  TAKE 3 TABLETS BY MOUTH EVERY DAY     Vitamin D 2000 UNITS tablet  Take 1 tablet (2,000 Units total) by mouth daily.

## 2013-08-02 ENCOUNTER — Encounter: Payer: Self-pay | Admitting: *Deleted

## 2013-08-26 ENCOUNTER — Ambulatory Visit: Payer: Medicare Other | Admitting: Family Medicine

## 2013-08-29 ENCOUNTER — Ambulatory Visit (INDEPENDENT_AMBULATORY_CARE_PROVIDER_SITE_OTHER): Payer: Medicare Other | Admitting: Family Medicine

## 2013-08-29 ENCOUNTER — Encounter: Payer: Self-pay | Admitting: Family Medicine

## 2013-08-29 VITALS — BP 192/124 | HR 75 | Temp 97.0°F | Ht 59.5 in | Wt 125.0 lb

## 2013-08-29 DIAGNOSIS — R51 Headache: Secondary | ICD-10-CM

## 2013-08-29 DIAGNOSIS — H6120 Impacted cerumen, unspecified ear: Secondary | ICD-10-CM

## 2013-08-29 DIAGNOSIS — I10 Essential (primary) hypertension: Secondary | ICD-10-CM

## 2013-08-29 MED ORDER — LISINOPRIL 40 MG PO TABS: 40.0000 mg | ORAL_TABLET | Freq: Every day | ORAL | Status: DC

## 2013-08-29 NOTE — Progress Notes (Signed)
Date:  08/29/2013   Name:  Robin [name]   DOB:  Jan 23, 1926   MRN:  [id number] Gender: female Age: 80 y.o.  Primary Physician:  Owens Loffler, MD   Chief Complaint: Follow-up   Subjective:   History of Present Illness:  Robin [name] is a 79 y.o. very pleasant female patient who presents with the following:  HTN: Tolerating all medications without side effects The patient's blood pressure dropped after she had a gastrointestinal bleed in the fall, so some of her blood pressure medicines were discontinued. Now that she has become more euvolemic, her hemoglobin was improved, and she has been eating normally her blood pressure has increased. She has been having some significant headaches and doesn't feel all that well.  BP Readings from Last 3 Encounters:  08/29/13 192/124  07/29/13 176/102  03/27/13 834/19    Basic Metabolic Panel:    Component Value Date/Time   NA 136 07/29/2013 0906   K 3.8 07/29/2013 0906   CL 98 07/29/2013 0906   CO2 32 07/29/2013 0906   BUN 16 07/29/2013 0906   CREATININE 1.0 07/29/2013 0906   GLUCOSE 83 07/29/2013 0906   CALCIUM 9.4 07/29/2013 0906    she also has significant cerumen impaction.   Past Medical History, Surgical History, Social History, Family History, Problem List, Medications, and Allergies have been reviewed and updated if relevant.  Review of Systems:  GEN: No acute illnesses, no fevers, chills. GI: No n/v/d, eating normally Pulm: No SOB Interactive and getting along well at home.  Otherwise, ROS is as per the HPI.  Objective:   Physical Examination: BP 192/124  Pulse 75  Temp(Src) 97 F (36.1 C) (Oral)  Ht 4' 11.5" (1.511 m)  Wt 125 lb (56.7 kg)  BMI 24.83 kg/m2   GEN: WDWN, NAD, Non-toxic, A & O x 3 HEENT: Atraumatic, Normocephalic. Neck supple. No masses, No LAD. Ears and Nose: No external deformity. CV: RRR, No M/G/R. No JVD. No thrill. No extra heart sounds. PULM: CTA B, no wheezes, crackles,  rhonchi. No retractions. No resp. distress. No accessory muscle use. EXTR: No c/c/e NEURO Normal gait.  PSYCH: Normally interactive. Conversant. Not depressed or anxious appearing.  Calm demeanor.   Laboratory and Imaging Data: Recent Results (from the past 2160 hour(s))  BASIC METABOLIC PANEL     Status: Abnormal   Collection Time    07/29/13  9:06 AM      Result Value Range   Sodium 136  135 - 145 mEq/L   Potassium 3.8  3.5 - 5.1 mEq/L   Chloride 98  96 - 112 mEq/L   CO2 32  19 - 32 mEq/L   Glucose, Bld 83  70 - 99 mg/dL   BUN 16  6 - 23 mg/dL   Creatinine, Ser 1.0  0.4 - 1.2 mg/dL   Calcium 9.4  8.4 - 10.5 mg/dL   GFR 59.34 (*) >60.00 mL/min  CBC WITH DIFFERENTIAL     Status: Abnormal   Collection Time    07/29/13  9:06 AM      Result Value Range   WBC 9.3  4.5 - 10.5 K/uL   RBC 4.94  3.87 - 5.11 Mil/uL   Hemoglobin 13.9  12.0 - 15.0 g/dL   HCT 42.3  36.0 - 46.0 %   MCV 85.5  78.0 - 100.0 fl   MCHC 32.8  30.0 - 36.0 g/dL   RDW 14.9 (*) 11.5 - 14.6 %   Platelets 246.0  150.0 - 400.0 K/uL   Neutrophils Relative % 61.3  43.0 - 77.0 %   Lymphocytes Relative 29.0  12.0 - 46.0 %   Monocytes Relative 8.5  3.0 - 12.0 %   Eosinophils Relative 0.7  0.0 - 5.0 %   Basophils Relative 0.5  0.0 - 3.0 %   Neutro Abs 5.7  1.4 - 7.7 K/uL   Lymphs Abs 2.7  0.7 - 4.0 K/uL   Monocytes Absolute 0.8  0.1 - 1.0 K/uL   Eosinophils Absolute 0.1  0.0 - 0.7 K/uL   Basophils Absolute 0.0  0.0 - 0.1 K/uL  HEPATIC FUNCTION PANEL     Status: None   Collection Time    07/29/13  9:06 AM      Result Value Range   Total Bilirubin 0.5  0.3 - 1.2 mg/dL   Bilirubin, Direct 0.1  0.0 - 0.3 mg/dL   Alkaline Phosphatase 76  39 - 117 U/L   AST 20  0 - 37 U/L   ALT 15  0 - 35 U/L   Total Protein 7.0  6.0 - 8.3 g/dL   Albumin 3.9  3.5 - 5.2 g/dL     Assessment & Plan:    Hypertension  Impacted ear wax  Headache(784.0)  Increase the patient's ACE inhibitor, and have close followup. I don't want  to dramatically increases patient's blood pressure medication for risk for falls.  Ceruminosis is noted.  Wax is removed by syringing and manual debridement. Instructions for home care to prevent wax buildup are given.   Patient Instructions  F/u 2-3 weeks.   No orders of the defined types were placed in this encounter.    New medications, updates to list, dose adjustments: Meds ordered this encounter  Medications  . lisinopril (PRINIVIL,ZESTRIL) 40 MG tablet    Sig: Take 1 tablet (40 mg total) by mouth daily.    Dispense:  30 tablet    Refill:  4    Signed,  Michaelia Beilfuss T. Lemonte Al, MD, [place] at [facility] [place] [address] Phone: [phone] Fax: [phone]    Medication List       This list is accurate as of: 08/29/13 11:59 PM.  Always use your most recent med list.               acetaminophen 500 MG tablet  Commonly known as:  TYLENOL  Take 500 mg by mouth daily as needed for pain or fever.     ALPRAZolam 0.25 MG tablet  Commonly known as:  XANAX  Take 0.25 mg by mouth at bedtime as needed.     FLUoxetine 10 MG capsule  Commonly known as:  PROZAC  Take 1 capsule (10 mg total) by mouth daily.     lisinopril 40 MG tablet  Commonly known as:  PRINIVIL,ZESTRIL  Take 1 tablet (40 mg total) by mouth daily.     montelukast 10 MG tablet  Commonly known as:  SINGULAIR  Take 1 tablet (10 mg total) by mouth at bedtime.     pantoprazole 40 MG tablet  Commonly known as:  PROTONIX  Take 1 tablet (40 mg total) by mouth 2 (two) times daily.     predniSONE 1 MG tablet  Commonly known as:  DELTASONE  TAKE 3 TABLETS BY MOUTH EVERY DAY     Vitamin D 2000 UNITS tablet  Take 1 tablet (2,000 Units total) by mouth daily.

## 2013-08-29 NOTE — Progress Notes (Signed)
Pre-visit discussion using our clinic review tool. No additional management support is needed unless otherwise documented below in the visit note.  

## 2013-08-29 NOTE — Patient Instructions (Signed)
F/u 2-3 weeks

## 2013-09-16 ENCOUNTER — Ambulatory Visit (INDEPENDENT_AMBULATORY_CARE_PROVIDER_SITE_OTHER): Payer: Medicare Other | Admitting: Family Medicine

## 2013-09-16 ENCOUNTER — Other Ambulatory Visit: Payer: Self-pay | Admitting: Family Medicine

## 2013-09-16 ENCOUNTER — Encounter: Payer: Self-pay | Admitting: Family Medicine

## 2013-09-16 ENCOUNTER — Ambulatory Visit: Payer: Medicare Other | Admitting: Family Medicine

## 2013-09-16 VITALS — BP 196/110 | HR 85 | Temp 97.6°F | Ht 59.5 in | Wt 125.5 lb

## 2013-09-16 DIAGNOSIS — R32 Unspecified urinary incontinence: Secondary | ICD-10-CM

## 2013-09-16 DIAGNOSIS — I10 Essential (primary) hypertension: Secondary | ICD-10-CM

## 2013-09-16 LAB — POCT URINALYSIS DIPSTICK
Bilirubin, UA: NEGATIVE
Glucose, UA: NEGATIVE
KETONES UA: NEGATIVE
Leukocytes, UA: NEGATIVE
Nitrite, UA: NEGATIVE
PH UA: 7.5
PROTEIN UA: NEGATIVE
SPEC GRAV UA: 1.01
Urobilinogen, UA: 0.2

## 2013-09-16 LAB — POCT UA - MICROSCOPIC ONLY

## 2013-09-16 MED ORDER — AMLODIPINE BESYLATE 5 MG PO TABS: 5.0000 mg | ORAL_TABLET | Freq: Every day | ORAL | Status: DC

## 2013-09-16 NOTE — Progress Notes (Signed)
Pre-visit discussion using our clinic review tool. No additional management support is needed unless otherwise documented below in the visit note.  

## 2013-09-16 NOTE — Assessment & Plan Note (Signed)
Urine clear on micro.

## 2013-09-16 NOTE — Patient Instructions (Signed)
Continue lisinopril, restart amlodipine at 5 mg daily. urine Follow up with Dr. Lorelei Pont in 2 weeks.

## 2013-09-16 NOTE — Progress Notes (Signed)
   Subjective:    Patient ID: Robin [name], female    DOB: 02-07-1926, 80 y.o.   MRN: [id number]  HPI 80 year old female with HTN and recent GI bleed on coumadin.  The patient's blood pressure dropped after she had a gastrointestinal bleed in the fall, so some of her blood pressure medicines were discontinued. Lisinopril and amlodipine.  She has become more euvolemic, her hemoglobin was improved at last check, and she has been eating normally.  At last2  OV Dr. Lorelei Pont  Restarted her ACEI, then increase to 40 mg at last OV  and planned to add BP meds slowly back up.  BP Readings from Last 3 Encounters:  09/16/13 196/110  08/29/13 192/124  07/29/13 176/102   Not checking BP at home lately. She is felling headaches are better.very well over all.  Minimal swelling... Some from past DVT in left leg. No chest pain exertional, stable breathing.  She has had some nocturia and loss of urine control at night in last few days. No dysuria. Some urgency and frequency during the day.  Using pads.    Review of Systems  Constitutional: Negative for fever and chills.       Objective:   Physical Exam  Constitutional: Vital signs are normal. She appears well-developed and well-nourished. She is cooperative.  Non-toxic appearance. She does not appear ill. No distress.  Elderly female in NAD with kyphosis of thoracic spine  HENT:  Head: Normocephalic.  Right Ear: Hearing, tympanic membrane, external ear and ear canal normal. Tympanic membrane is not erythematous, not retracted and not bulging.  Left Ear: Hearing, tympanic membrane, external ear and ear canal normal. Tympanic membrane is not erythematous, not retracted and not bulging.  Nose: No mucosal edema or rhinorrhea. Right sinus exhibits no maxillary sinus tenderness and no frontal sinus tenderness. Left sinus exhibits no maxillary sinus tenderness and no frontal sinus tenderness.  Mouth/Throat: Uvula is midline, oropharynx is clear  and moist and mucous membranes are normal.  Eyes: Conjunctivae, EOM and lids are normal. Pupils are equal, round, and reactive to light. Lids are everted and swept, no foreign bodies found.  Neck: Trachea normal and normal range of motion. Neck supple. Carotid bruit is not present. No mass and no thyromegaly present.  Cardiovascular: Normal rate, regular rhythm, S1 normal, S2 normal, normal heart sounds, intact distal pulses and normal pulses.  Exam reveals no gallop and no friction rub.   No murmur heard. Mild swelling left leg, chronic  Pulmonary/Chest: Effort normal and breath sounds normal. Not tachypneic. No respiratory distress. She has no decreased breath sounds. She has no wheezes. She has no rhonchi. She has no rales.  Abdominal: Soft. Normal appearance and bowel sounds are normal. There is no tenderness.  Neurological: She is alert.  Skin: Skin is warm, dry and intact. No rash noted.  Psychiatric: Her speech is normal and behavior is normal. Judgment and thought content normal. Her mood appears not anxious. Cognition and memory are normal. She does not exhibit a depressed mood.          Assessment & Plan:

## 2013-09-16 NOTE — Assessment & Plan Note (Signed)
Improved but far from goal. Continue max lisinopril, now restart amlodipine at 5 mg daily. Follow up closely with PCP.

## 2013-10-02 ENCOUNTER — Ambulatory Visit: Payer: Medicare Other | Admitting: Family Medicine

## 2013-10-02 ENCOUNTER — Encounter: Payer: Self-pay | Admitting: Family Medicine

## 2013-10-02 ENCOUNTER — Ambulatory Visit (INDEPENDENT_AMBULATORY_CARE_PROVIDER_SITE_OTHER): Payer: Medicare Other | Admitting: Family Medicine

## 2013-10-02 VITALS — BP 161/96 | HR 79 | Temp 97.3°F | Ht 59.5 in | Wt 128.5 lb

## 2013-10-02 DIAGNOSIS — G252 Other specified forms of tremor: Secondary | ICD-10-CM

## 2013-10-02 DIAGNOSIS — I10 Essential (primary) hypertension: Secondary | ICD-10-CM

## 2013-10-02 DIAGNOSIS — G250 Essential tremor: Secondary | ICD-10-CM | POA: Insufficient documentation

## 2013-10-02 HISTORY — DX: Essential tremor: G25.0

## 2013-10-02 MED ORDER — METOPROLOL SUCCINATE ER 50 MG PO TB24: 50.0000 mg | ORAL_TABLET | Freq: Every day | ORAL | Status: AC

## 2013-10-02 MED ORDER — PANTOPRAZOLE SODIUM 40 MG PO TBEC: 40.0000 mg | DELAYED_RELEASE_TABLET | Freq: Every day | ORAL | Status: DC

## 2013-10-02 NOTE — Progress Notes (Signed)
Date:  10/02/2013   Name:  ARSENIA GORACKE   DOB:  09-Apr-1926   MRN:  [id number]  Primary Physician:  Owens Loffler, MD   Chief Complaint: Hypertension   Subjective:   History of Present Illness:  Robin [name] is a 80 y.o. very pleasant female patient who presents with the following:  Elderly patient here is f/u for HTN.  HTN: Tolerating all medications without side effects Had been markedly out of control. BP low after d/c meds post GI bleed last year.  No CP, no sob. No HA.  BP Readings from Last 3 Encounters:  10/02/13 161/96  09/16/13 196/110  08/29/13 086/761    Basic Metabolic Panel:    Component Value Date/Time   NA 136 07/29/2013 0906   K 3.8 07/29/2013 0906   CL 98 07/29/2013 0906   CO2 32 07/29/2013 0906   BUN 16 07/29/2013 0906   CREATININE 1.0 07/29/2013 0906   GLUCOSE 83 07/29/2013 0906   CALCIUM 9.4 07/29/2013 0906   Also, her essential tremor has worsened somewhat. Father, brother, aunts and uncles have this.  Past Medical History, Surgical History, Social History, Family History, Problem List, Medications, and Allergies have been reviewed and updated if relevant.  Review of Systems:  GEN: No acute illnesses, no fevers, chills. GI: No n/v/d, eating normally Pulm: No SOB Interactive and getting along well at home.  Otherwise, ROS is as per the HPI.  Objective:   Physical Examination: BP 161/96  Pulse 79  Temp(Src) 97.3 F (36.3 C) (Oral)  Ht 4' 11.5" (1.511 m)  Wt 128 lb 8 oz (58.287 kg)  BMI 25.53 kg/m2   GEN: WDWN, NAD, Non-toxic, A & O x 3 HEENT: Atraumatic, Normocephalic. Neck supple. No masses, No LAD. Ears and Nose: No external deformity. CV: RRR, No M/G/R. No JVD. No thrill. No extra heart sounds. PULM: CTA B, no wheezes, crackles, rhonchi. No retractions. No resp. distress. No accessory muscle use. EXTR: No c/c/e NEURO Normal gait.  PSYCH: Normally interactive. Conversant. Not depressed or anxious appearing.  Calm  demeanor.   Laboratory and Imaging Data:  Assessment & Plan:   Hypertension  Hereditary essential tremor  >25 minutes spent in face to face time with patient, >50% spent in counselling or coordination of care  Try beta-blocker to see if this controls BP as well as tremor. They will check BP at home and call me if over 140/90 or too low. Additional time spent answering questions and discussing tremors and record review.  New Prescriptions   METOPROLOL SUCCINATE (TOPROL-XL) 50 MG 24 HR TABLET    Take 1 tablet (50 mg total) by mouth daily. Take with or immediately following a meal.   No orders of the defined types were placed in this encounter.   Signed,  Maud Deed. Travon Crochet, MD, [place] at [facility] [place] [address] Phone: [phone] Fax: [phone]  Patient Instructions  F/u 6 months.  BP < 140/90.  Patient's Medications  New Prescriptions   METOPROLOL SUCCINATE (TOPROL-XL) 50 MG 24 HR TABLET    Take 1 tablet (50 mg total) by mouth daily. Take with or immediately following a meal.  Previous Medications   ACETAMINOPHEN (TYLENOL) 500 MG TABLET    Take 500 mg by mouth daily as needed for pain or fever.   ALPRAZOLAM (XANAX) 0.25 MG TABLET    Take 0.25 mg by mouth at bedtime as needed.   AMLODIPINE (NORVASC)  5 MG TABLET    Take 1 tablet (5 mg total) by mouth daily.   CHOLECALCIFEROL (VITAMIN D) 2000 UNITS TABLET    Take 1 tablet (2,000 Units total) by mouth daily.   FLUOXETINE (PROZAC) 10 MG CAPSULE    TAKE 1 CAPSULE (10 MG TOTAL) BY MOUTH DAILY.   LISINOPRIL (PRINIVIL,ZESTRIL) 40 MG TABLET    Take 1 tablet (40 mg total) by mouth daily.   MONTELUKAST (SINGULAIR) 10 MG TABLET    Take 1 tablet (10 mg total) by mouth at bedtime.   PREDNISONE (DELTASONE) 1 MG TABLET    TAKE 3 TABLETS BY MOUTH EVERY DAY  Modified Medications   Modified Medication Previous Medication   PANTOPRAZOLE (PROTONIX) 40 MG TABLET pantoprazole  (PROTONIX) 40 MG tablet      Take 1 tablet (40 mg total) by mouth daily.    Take 1 tablet (40 mg total) by mouth 2 (two) times daily.  Discontinued Medications   No medications on file

## 2013-10-02 NOTE — Progress Notes (Signed)
Pre visit review using our clinic review tool, if applicable. No additional management support is needed unless otherwise documented below in the visit note. 

## 2013-10-02 NOTE — Patient Instructions (Signed)
F/u 6 months.  BP < 140/90.

## 2013-10-04 ENCOUNTER — Telehealth: Payer: Self-pay | Admitting: Family Medicine

## 2013-10-04 NOTE — Telephone Encounter (Signed)
Relevant patient education assigned to patient using Emmi. ° °

## 2013-10-16 ENCOUNTER — Other Ambulatory Visit: Payer: Self-pay | Admitting: Family Medicine

## 2013-10-16 NOTE — Telephone Encounter (Signed)
Last office visit 10/02/2013.  Ok to refill?

## 2013-10-16 NOTE — Telephone Encounter (Signed)
Alprazolam called to [facility]. 

## 2013-10-16 NOTE — Telephone Encounter (Signed)
Ok to refill both as written.

## 2013-11-18 ENCOUNTER — Encounter: Payer: Self-pay | Admitting: Family Medicine

## 2013-11-18 ENCOUNTER — Ambulatory Visit (INDEPENDENT_AMBULATORY_CARE_PROVIDER_SITE_OTHER): Payer: Medicare Other | Admitting: Family Medicine

## 2013-11-18 VITALS — BP 165/93 | HR 58 | Temp 97.3°F | Ht 59.5 in | Wt 130.5 lb

## 2013-11-18 DIAGNOSIS — I10 Essential (primary) hypertension: Secondary | ICD-10-CM

## 2013-11-18 DIAGNOSIS — J45909 Unspecified asthma, uncomplicated: Secondary | ICD-10-CM

## 2013-11-18 DIAGNOSIS — I872 Venous insufficiency (chronic) (peripheral): Secondary | ICD-10-CM

## 2013-11-18 MED ORDER — PANTOPRAZOLE SODIUM 40 MG PO TBEC: 40.0000 mg | DELAYED_RELEASE_TABLET | Freq: Every day | ORAL | Status: DC

## 2013-11-18 MED ORDER — FLUTICASONE-SALMETEROL 250-50 MCG/DOSE IN AEPB: 1.0000 | INHALATION_SPRAY | Freq: Two times a day (BID) | RESPIRATORY_TRACT | Status: DC

## 2013-11-18 NOTE — Progress Notes (Signed)
Date:  11/18/2013   Name:  Robin [name]   DOB:  07-18-1926   MRN:  [id number]  Primary Physician:  Owens Loffler, MD   Chief Complaint: Swelling in Feet   Subjective:   History of Present Illness:  Robin [name] is a 80 y.o. very pleasant female patient who presents with the following: F/u   BP elevated. Home log reviewed 130's syst/80's. Tolerating medication fine.  Feet. Reddish in coloration, no pain, LE. Improves with elevation of foot.  Athma, singulair and rescue inhaler.  Only 1 that she has used, rare albuterol. SOB now with minimal exertion.  Singular only now, rare alb use.  advair 100  Past Medical History, Surgical History, Social History, Family History, Problem List, Medications, and Allergies have been reviewed and updated if relevant.  Review of Systems:  GEN: No acute illnesses, no fevers, chills. GI: No n/v/d, eating normally Pulm: No SOB Interactive and getting along well at home.  Otherwise, ROS is as per the HPI.  Objective:   Physical Examination: BP 165/93  Pulse 58  Temp(Src) 97.3 F (36.3 C) (Oral)  Ht 4' 11.5" (1.511 m)  Wt 130 lb 8 oz (59.194 kg)  BMI 25.93 kg/m2   GEN: WDWN, NAD, Non-toxic, A & O x 3 HEENT: Atraumatic, Normocephalic. Neck supple. No masses, No LAD. Ears and Nose: No external deformity. CV: RRR, No M/G/R. No JVD. No thrill. No extra heart sounds. PULM: CTA B, no wheezes, crackles, rhonchi. No retractions. No resp. distress. No accessory muscle use. EXTR: No c/c/e NEURO Normal gait.  PSYCH: Normally interactive. Conversant. Not depressed or anxious appearing.  Calm demeanor.   Laboratory and Imaging Data: Lab Results  Component Value Date   WBC 9.3 07/29/2013   HGB 13.9 07/29/2013   HCT 42.3 07/29/2013   PLT 246.0 07/29/2013   GLUCOSE 83 07/29/2013   ALT 15 07/29/2013   AST 20 07/29/2013   NA 136 07/29/2013   K 3.8 07/29/2013   CL 98 07/29/2013   CREATININE 1.0 07/29/2013   BUN 16  07/29/2013   CO2 32 07/29/2013   INR 1.0 05/06/2009     Assessment & Plan:   Chronic venous insufficiency  Hypertension  Asthma, chronic  Reassured about legs. Good pulses.  BP home log reassuring.  Add advair to regiment. Given sample in office.  Follow-up: Return in about 6 months (around 05/20/2014).  New Prescriptions   FLUTICASONE-SALMETEROL (ADVAIR DISKUS) 250-50 MCG/DOSE AEPB    Inhale 1 puff into the lungs 2 (two) times daily.   No orders of the defined types were placed in this encounter.   There are no Patient Instructions on file for this visit.  Signed,  Maud Deed. Efraim Vanallen, MD, [place] [facility] [place] [address] Phone: [phone] Fax: [phone]  Patient's Medications  New Prescriptions   FLUTICASONE-SALMETEROL (ADVAIR DISKUS) 250-50 MCG/DOSE AEPB    Inhale 1 puff into the lungs 2 (two) times daily.  Previous Medications   ACETAMINOPHEN (TYLENOL) 500 MG TABLET    Take 500 mg by mouth daily as needed for pain or fever.   AMLODIPINE (NORVASC) 5 MG TABLET    Take 1 tablet (5 mg total) by mouth daily.   CHOLECALCIFEROL (VITAMIN D) 2000 UNITS TABLET    Take 1 tablet (2,000 Units total) by mouth daily.   FLUOXETINE (PROZAC) 10 MG CAPSULE    TAKE 1 CAPSULE (10 MG TOTAL) BY MOUTH DAILY.  LISINOPRIL (PRINIVIL,ZESTRIL) 40 MG TABLET    Take 1 tablet (40 mg total) by mouth daily.   METOPROLOL SUCCINATE (TOPROL-XL) 50 MG 24 HR TABLET    Take 1 tablet (50 mg total) by mouth daily. Take with or immediately following a meal.   MONTELUKAST (SINGULAIR) 10 MG TABLET    Take 1 tablet (10 mg total) by mouth at bedtime.   PREDNISONE (DELTASONE) 1 MG TABLET    TAKE 3 TABLETS BY MOUTH EVERY DAY  Modified Medications   Modified Medication Previous Medication   ALPRAZOLAM (XANAX) 0.25 MG TABLET ALPRAZolam (XANAX) 0.25 MG tablet      TAKE 1 TABLET BY MOUTH Daily    TAKE 1 TABLET BY MOUTH EVERY 8 HOURS AS NEEDED    PANTOPRAZOLE (PROTONIX) 40 MG TABLET pantoprazole (PROTONIX) 40 MG tablet      Take 1 tablet (40 mg total) by mouth daily.    Take 1 tablet (40 mg total) by mouth daily.  Discontinued Medications   No medications on file

## 2013-11-18 NOTE — Progress Notes (Signed)
Pre visit review using our clinic review tool, if applicable. No additional management support is needed unless otherwise documented below in the visit note. 

## 2013-11-19 MED ORDER — PANTOPRAZOLE SODIUM 40 MG PO TBEC
40.0000 mg | DELAYED_RELEASE_TABLET | Freq: Every day | ORAL | Status: DC
Start: 2013-11-19 — End: 2014-11-01

## 2013-11-19 NOTE — Addendum Note (Signed)
Addended by: Carter Kitten on: 11/19/2013 02:52 PM   Modules accepted: Orders

## 2013-12-16 ENCOUNTER — Other Ambulatory Visit: Payer: Self-pay | Admitting: Family Medicine

## 2014-01-22 ENCOUNTER — Other Ambulatory Visit: Payer: Self-pay | Admitting: Family Medicine

## 2014-02-16 ENCOUNTER — Other Ambulatory Visit: Payer: Self-pay | Admitting: Family Medicine

## 2014-02-16 NOTE — Telephone Encounter (Signed)
Last office visit 11/18/2013.  Ok to refill?

## 2014-03-16 ENCOUNTER — Other Ambulatory Visit: Payer: Self-pay | Admitting: Family Medicine

## 2014-03-19 ENCOUNTER — Other Ambulatory Visit: Payer: Self-pay | Admitting: Family Medicine

## 2014-04-07 ENCOUNTER — Ambulatory Visit (INDEPENDENT_AMBULATORY_CARE_PROVIDER_SITE_OTHER): Payer: Medicare Other | Admitting: Family Medicine

## 2014-04-07 ENCOUNTER — Encounter: Payer: Self-pay | Admitting: Family Medicine

## 2014-04-07 VITALS — BP 174/87 | HR 68 | Temp 98.0°F | Ht 59.5 in | Wt 134.5 lb

## 2014-04-07 DIAGNOSIS — I10 Essential (primary) hypertension: Secondary | ICD-10-CM

## 2014-04-07 DIAGNOSIS — Z0000 Encounter for general adult medical examination without abnormal findings: Secondary | ICD-10-CM

## 2014-04-07 DIAGNOSIS — M899 Disorder of bone, unspecified: Secondary | ICD-10-CM

## 2014-04-07 DIAGNOSIS — M8580 Other specified disorders of bone density and structure, unspecified site: Secondary | ICD-10-CM

## 2014-04-07 DIAGNOSIS — IMO0002 Reserved for concepts with insufficient information to code with codable children: Secondary | ICD-10-CM

## 2014-04-07 DIAGNOSIS — M949 Disorder of cartilage, unspecified: Secondary | ICD-10-CM

## 2014-04-07 DIAGNOSIS — Z23 Encounter for immunization: Secondary | ICD-10-CM

## 2014-04-07 NOTE — Progress Notes (Signed)
Pre visit review using our clinic review tool, if applicable. No additional management support is needed unless otherwise documented below in the visit note. 

## 2014-04-07 NOTE — Patient Instructions (Signed)

## 2014-04-07 NOTE — Progress Notes (Signed)
Dr. Frederico Hamman T. Hailynn Slovacek, MD, [facility] [place], [address] Phone: [phone] Fax: [phone]  04/07/2014  Patient: Robin [name], MRN: [id number], DOB: August 05, 1926, 80 y.o.  Primary Physician:  Owens Loffler, MD  Chief Complaint: Annual Exam  Subjective:   Robin [name] is a 80 y.o. pleasant patient who presents for a medicare wellness examination:  Health Maintenance Summary Reviewed and updated, unless pt declines services.  Tobacco History Reviewed. Non-smoker Alcohol: No concerns, no excessive use Exercise Habits: Some activity, rec at least 30 mins 5 times a week STD concerns: none Drug Use: None Birth control method: n/a Menses regular: n/a Lumps or breast concerns: no Breast Cancer Family History: no  Health Maintenance  Topic Date Due  . Influenza Vaccine  04/08/2019 (Originally 03/08/2014)  . Zostavax  04/08/2019 (Originally 12/24/1987)  . Tetanus/tdap  04/08/2019 (Originally 12/24/1946)  . Pneumococcal Polysaccharide Vaccine Age 63 And Over  Addressed   Immunization History  Administered Date(s) Administered  . Pneumococcal Conjugate-13 04/07/2014   BP: running, 130/68 - after medication.  None today. Never > 140's  (She did not take her BP medication) The patient and her daughter report that her blood pressures normally normal. She did not take any of her blood pressure medications today prior to coming to the office.  She does have a history of osteoporosis and she is chronically on oral prednisone. I tried to wean her off of this on at least 2 different occasions, she was never able to tolerate it secondary to chronic pain.  She needs to have followup DEXA scan given chronic steroid use and risk for fracture. History of multiple compression fractures in the past.  Patient Active Problem List   Diagnosis Date Noted  . Chronic kidney disease (CKD), stage III (moderate) 07/29/2013   Priority: High  . Primary vulvar cancer, Stage 2     Priority: High  . Alzheimers disease     Priority: High  . Asthma, chronic 01/10/2013    Priority: High  . Chronic steroid use 01/10/2013    Priority: Medium  . Chronic back pain 01/10/2013    Priority: Medium  . Generalized anxiety disorder 01/10/2013    Priority: Medium  . Hereditary essential tremor 10/02/2013  . Urinary incontinence 09/16/2013  . Hypertension 07/29/2013  . Osteoarthritis   . Hyperlipidemia   . Compression fracture of spine   . GI bleed   . Acute esophagitis 01/10/2013   Past Medical History  Diagnosis Date  . Asthma   . GERD (gastroesophageal reflux disease)   . DVT (deep venous thrombosis)   . Osteoporosis   . Vulvar cancer   . Chronic back pain   . Osteoarthritis   . Hypertension   . Hyperlipidemia   . Anxiety   . Alzheimers disease     mild  . Compression fracture of spine 2014    x3  . GI bleed 2014    ICU stay at [facility] x 4 days  . Chronic kidney disease (CKD), stage III (moderate) 07/29/2013  . Hereditary essential tremor 10/02/2013   Past Surgical History  Procedure Laterality Date  . Total abdominal hysterectomy w/ bilateral salpingoophorectomy  1967    [place]  . Hemithyroidectomy  1972    removed half, [place]  . Nasal sinus surgery  1977.,79 ,97 and 98    removed polyps, [place]  . Cataract extraction  2004  . Modified radical vulvectomy with inguinal node removal  Left 2008 and 2010    2nd Cancer surgery at [place] History  . Marital Status: Widowed    Spouse Name: N/A    Number of Children: N/A  . Years of Education: 12   Occupational History  . retired    Social History Main Topics  . Smoking status: Never Smoker   . Smokeless tobacco: Never Used  . Alcohol Use: No  . Drug Use: No  . Sexual Activity: Not Currently   Other Topics Concern  . Not on file   Social History Narrative   Had been living in [place]   Living with daughter in  [place] now   Family History  Problem Relation Age of Onset  . Heart disease Mother     chf  . Cancer Father     colon cancer  . Cancer Brother     renal cancer   Allergies  Allergen Reactions  . Latex   . Sulfa Antibiotics     Medication list has been reviewed and updated.   General: Denies fever, chills, sweats. No significant weight loss. Eyes: Denies blurring,significant itching ENT: Denies earache, sore throat, and hoarseness.  Cardiovascular: Denies chest pains, palpitations, dyspnea on exertion,  Respiratory: Denies cough, dyspnea at rest,wheeezing Breast: no concerns about lumps GI: Denies nausea, vomiting, diarrhea, constipation, change in bowel habits, abdominal pain, melena, hematochezia GU: Denies dysuria, hematuria, urinary hesitancy, nocturia, denies STD risk, no concerns about discharge Musculoskeletal: Denies back pain, joint pain Derm: Denies rash, itching Neuro: Denies  paresthesias, frequent falls, frequent headaches Psych: Denies depression, anxiety Endocrine: Denies cold intolerance, heat intolerance, polydipsia Heme: Denies enlarged lymph nodes Allergy: No hayfever  Objective:   BP 174/87  Pulse 68  Temp(Src) 98 F (36.7 C) (Oral)  Ht 4' 11.5" (1.511 m)  Wt 134 lb 8 oz (61.009 kg)  BMI 26.72 kg/m2  SpO2 96%  The patient completed a fall screen and PHQ-2 and PHQ-9 if necessary, which is documented in the EHR. The CMA/LPN/RN who assisted the patient verbally completed with them and documented results in [facility] EHR.   Hearing Screening   Method: Audiometry   125Hz  250Hz  500Hz  1000Hz  2000Hz  4000Hz  8000Hz   Right ear:   0 0 0 0   Left ear:   0 0 0 0     Visual Acuity Screening   Right eye Left eye Both eyes  Without correction:     With correction: 20/25 20/20 20/20     GEN: well developed, well nourished, no acute distress Eyes: conjunctiva and lids normal, PERRLA, EOMI ENT: TM clear, nares clear, oral exam WNL Neck: supple,  no lymphadenopathy, no thyromegaly, no JVD Pulm: clear to auscultation and percussion, respiratory effort normal CV: regular rate and rhythm, S1-S2, no murmur, rub or gallop, no bruits Chest: no scars, masses, no lumps BREAST: breast exam declined GI: soft, non-tender; no hepatosplenomegaly, masses; active bowel sounds all quadrants GU: GU exam declined Lymph: no cervical, axillary or inguinal adenopathy MSK: gait normal, muscle tone and strength WNL, no joint swelling, effusions, discoloration, crepitus  SKIN: clear, good turgor, color WNL, no rashes, lesions, or ulcerations Neuro: normal mental status, normal strength, sensation, and motion Psych: alert; oriented to person, place and time, normally interactive and not anxious or depressed in appearance.   Assessment and Plan:   Routine general medical examination at a health care facility  Osteopenia - Plan: DG Bone Density: multiple compression fracture and chronic steroid use. Repeat  study.  Need for prophylactic vaccination against Streptococcus pneumoniae (pneumococcus) - Plan: Pneumococcal conjugate vaccine 13-valent  Essential hypertension: notably elevated today, but did not take meds. I have to believe the patient and her well-informed daughter that normally it is fine.   Chronic steroid use: as above.  Health Maintenance Exam: The patient's preventative maintenance and recommended screening tests for an annual wellness exam were reviewed in full today. Brought up to date unless services declined.  Counselled on the importance of diet, exercise, and its role in overall health and mortality. The patient's FH and SH was reviewed, including their home life, tobacco status, and drug and alcohol status.  I have personally reviewed the Medicare Annual Wellness questionnaire and have noted 1. The patient's medical and social history 2. Their use of alcohol, tobacco or illicit drugs 3. Their current medications and  supplements 4. The patient's functional ability including ADL's, fall risks, home safety risks and hearing or visual             impairment. 5. Diet and physical activities 6. Evidence for depression or mood disorders  The patients weight, height, BMI and visual acuity have been recorded in the chart I have made referrals, counseling and provided education to the patient based review of the above and I have provided the pt with a written personalized care plan for preventive services.  I have provided the patient with a copy of your personalized plan for preventive services. Instructed to take the time to review along with their updated medication list.  Follow-up: No Follow-up on file. Or follow-up in 1 year for complete physical examination  New Prescriptions   No medications on file   Orders Placed This Encounter  Procedures  . DG Bone Density  . Pneumococcal conjugate vaccine 13-valent    Signed,  Bently Wyss T. Elias Dennington, MD   Patient's Medications  New Prescriptions   No medications on file  Previous Medications   ALPRAZOLAM (XANAX) 0.25 MG TABLET    TAKE 1 TABLET BY MOUTH Daily   AMLODIPINE (NORVASC) 5 MG TABLET    Take 1 tablet (5 mg total) by mouth daily.   CHOLECALCIFEROL (VITAMIN D) 2000 UNITS TABLET    TAKE 1 TABLET (2,000 UNITS TOTAL) BY MOUTH DAILY.   FLUOXETINE (PROZAC) 10 MG CAPSULE    TAKE 1 CAPSULE (10 MG TOTAL) BY MOUTH DAILY.   LISINOPRIL (PRINIVIL,ZESTRIL) 40 MG TABLET    TAKE 1 TABLET (40 MG TOTAL) BY MOUTH DAILY.   METOPROLOL SUCCINATE (TOPROL-XL) 50 MG 24 HR TABLET    Take 1 tablet (50 mg total) by mouth daily. Take with or immediately following a meal.   MONTELUKAST (SINGULAIR) 10 MG TABLET    TAKE 1 TABLET (10 MG TOTAL) BY MOUTH AT BEDTIME.   PANTOPRAZOLE (PROTONIX) 40 MG TABLET    Take 1 tablet (40 mg total) by mouth daily.   PREDNISONE (DELTASONE) 1 MG TABLET    TAKE 3 TABLETS BY MOUTH EVERY DAY  Modified Medications   No medications on file   Discontinued Medications   ACETAMINOPHEN (TYLENOL) 500 MG TABLET    Take 500 mg by mouth daily as needed for pain or fever.   FLUTICASONE-SALMETEROL (ADVAIR DISKUS) 250-50 MCG/DOSE AEPB    Inhale 1 puff into the lungs 2 (two) times daily.

## 2014-04-15 ENCOUNTER — Other Ambulatory Visit: Payer: Self-pay | Admitting: Family Medicine

## 2014-04-16 NOTE — Telephone Encounter (Signed)
Called to [facility]. 

## 2014-04-16 NOTE — Telephone Encounter (Signed)
Ok to refill 60, 3 ref 

## 2014-04-16 NOTE — Telephone Encounter (Signed)
Last office visit 04/07/2014.  Ok to refill?

## 2014-05-21 ENCOUNTER — Ambulatory Visit: Payer: Self-pay | Admitting: Family Medicine

## 2014-05-21 ENCOUNTER — Telehealth: Payer: Self-pay

## 2014-05-21 ENCOUNTER — Ambulatory Visit (INDEPENDENT_AMBULATORY_CARE_PROVIDER_SITE_OTHER): Payer: Medicare Other | Admitting: Family Medicine

## 2014-05-21 ENCOUNTER — Encounter: Payer: Self-pay | Admitting: Family Medicine

## 2014-05-21 VITALS — BP 128/80 | HR 66 | Temp 98.0°F | Ht 59.5 in | Wt 136.2 lb

## 2014-05-21 DIAGNOSIS — R3915 Urgency of urination: Secondary | ICD-10-CM

## 2014-05-21 DIAGNOSIS — E785 Hyperlipidemia, unspecified: Secondary | ICD-10-CM

## 2014-05-21 DIAGNOSIS — N183 Chronic kidney disease, stage 3 unspecified: Secondary | ICD-10-CM

## 2014-05-21 DIAGNOSIS — I82409 Acute embolism and thrombosis of unspecified deep veins of unspecified lower extremity: Secondary | ICD-10-CM | POA: Insufficient documentation

## 2014-05-21 DIAGNOSIS — R600 Localized edema: Secondary | ICD-10-CM

## 2014-05-21 DIAGNOSIS — R0600 Dyspnea, unspecified: Secondary | ICD-10-CM

## 2014-05-21 DIAGNOSIS — M79605 Pain in left leg: Secondary | ICD-10-CM

## 2014-05-21 LAB — CBC WITH DIFFERENTIAL/PLATELET
BASOS PCT: 0.6 % (ref 0.0–3.0)
Basophils Absolute: 0.1 10*3/uL (ref 0.0–0.1)
EOS ABS: 0.1 10*3/uL (ref 0.0–0.7)
EOS PCT: 1.5 % (ref 0.0–5.0)
HCT: 46.5 % — ABNORMAL HIGH (ref 36.0–46.0)
Hemoglobin: 15.4 g/dL — ABNORMAL HIGH (ref 12.0–15.0)
LYMPHS PCT: 31.1 % (ref 12.0–46.0)
Lymphs Abs: 2.8 10*3/uL (ref 0.7–4.0)
MCHC: 33 g/dL (ref 30.0–36.0)
MCV: 91.3 fl (ref 78.0–100.0)
Monocytes Absolute: 0.7 10*3/uL (ref 0.1–1.0)
Monocytes Relative: 8.2 % (ref 3.0–12.0)
NEUTROS PCT: 58.6 % (ref 43.0–77.0)
Neutro Abs: 5.3 10*3/uL (ref 1.4–7.7)
PLATELETS: 270 10*3/uL (ref 150.0–400.0)
RBC: 5.1 Mil/uL (ref 3.87–5.11)
RDW: 13.5 % (ref 11.5–15.5)
WBC: 9 10*3/uL (ref 4.0–10.5)

## 2014-05-21 LAB — POCT URINALYSIS DIPSTICK
Bilirubin, UA: NEGATIVE
Glucose, UA: NEGATIVE
Ketones, UA: NEGATIVE
Leukocytes, UA: NEGATIVE
NITRITE UA: NEGATIVE
PH UA: 6.5
PROTEIN UA: NEGATIVE
Spec Grav, UA: 1.015
Urobilinogen, UA: 0.2

## 2014-05-21 LAB — BASIC METABOLIC PANEL
BUN: 15 mg/dL (ref 6–23)
CALCIUM: 9.4 mg/dL (ref 8.4–10.5)
CO2: 27 mEq/L (ref 19–32)
CREATININE: 1.1 mg/dL (ref 0.4–1.2)
Chloride: 98 mEq/L (ref 96–112)
GFR: 52.76 mL/min — ABNORMAL LOW (ref >60.00)
Glucose, Bld: 100 mg/dL — ABNORMAL HIGH (ref 70–99)
Potassium: 3.9 mEq/L (ref 3.5–5.1)
Sodium: 137 mEq/L (ref 135–145)

## 2014-05-21 LAB — HEPATIC FUNCTION PANEL
ALK PHOS: 74 U/L (ref 39–117)
ALT: 14 U/L (ref 0–35)
AST: 19 U/L (ref 0–37)
Albumin: 3.6 g/dL (ref 3.5–5.2)
BILIRUBIN DIRECT: 0 mg/dL (ref 0.0–0.3)
BILIRUBIN TOTAL: 0.4 mg/dL (ref 0.2–1.2)
TOTAL PROTEIN: 7.7 g/dL (ref 6.0–8.3)

## 2014-05-21 LAB — BRAIN NATRIURETIC PEPTIDE: Pro B Natriuretic peptide (BNP): 210 pg/mL — ABNORMAL HIGH (ref 0.0–100.0)

## 2014-05-21 NOTE — Progress Notes (Signed)
Pre visit review using our clinic review tool, if applicable. No additional management support is needed unless otherwise documented below in the visit note. 

## 2014-05-21 NOTE — Patient Instructions (Signed)

## 2014-05-21 NOTE — Progress Notes (Signed)
Dr. Frederico Hamman T. Chistian Kasler, MD, [facility] Medicine [place], [address] Phone: [phone] Fax: [phone]  05/21/2014  Patient: Robin [name], MRN: [id number], DOB: Oct 23, 1925, 80 y.o.  Primary Physician:  Owens Loffler, MD  Chief Complaint: Leg Pain and Leg Swelling  Subjective:   Robin [name] is a 80 y.o. very pleasant female patient who presents with the following:  Elderly patient with a history of multiple DVTs, starting in 2008 when she was anticoagulated for greater than 5 years. She also had severe GI bleeding requiring intensive care stay for close to a week about 2 years ago. At that point anticoagulation was discontinued. She is here today with acute swelling and pain in the left leg.  She also has been urinating less and normal. She is not having any burning or stinging. She is accompanied by her daughter, who is providing much of the history.  Not urinating that much over the last few days. No burning or stinging.   Past Medical History, Surgical History, Social History, Family History, Problem List, Medications, and Allergies have been reviewed and updated if relevant.   GEN: No acute illnesses, no fevers, chills. GI: No n/v/d, eating normally Pulm: No SOB Interactive and getting along well at home.  Otherwise, ROS is as per the HPI.  Objective:   BP 128/80  Pulse 66  Temp(Src) 98 F (36.7 C) (Oral)  Ht 4' 11.5" (1.511 m)  Wt 136 lb 4 oz (61.803 kg)  BMI 27.07 kg/m2  GEN: WDWN, NAD, Non-toxic, A & O x 3 HEENT: Atraumatic, Normocephalic. Neck supple. No masses, No LAD. Ears and Nose: No external deformity. CV: RRR, No M/G/R. No JVD. No thrill. No extra heart sounds. PULM: CTA B, no wheezes, crackles, rhonchi. No retractions. No resp. distress. No accessory muscle use. PSYCH: Normally interactive. Conversant. Not depressed or anxious appearing.  Calm demeanor.   Ext: 1+ LE on LEFT. Mildly  tender to palpation, but not warm.  Laboratory and Imaging Data:  Assessment and Plan:   Urinary urgency - Plan: POCT urinalysis dipstick, Urine culture  Chronic kidney disease (CKD), stage III (moderate) - Plan: Basic metabolic panel  Left leg pain - Plan: CBC with Differential, Hepatic function panel, Lower Extremity Venous Reflux Left, CANCELED: US Venous Img Lower Unilateral Left  Leg edema, left - Plan: CBC with Differential, Hepatic function panel, Lower Extremity Venous Reflux Left, CANCELED: US Venous Img Lower Unilateral Left  Dyspnea - Plan: Brain natriuretic peptide  Concern for acute DVT. We will obtain STAT doppler to assess.  Check labs for above, check renal, liver function, BNP.  Follow-up: No Follow-up on file.  New Prescriptions   No medications on file   Orders Placed This Encounter  Procedures  . Urine culture  . Basic metabolic panel  . CBC with Differential  . Hepatic function panel  . Brain natriuretic peptide  . POCT urinalysis dipstick  . Lower Extremity Venous Reflux Left    Signed,  Robert Sunga T. Salvatrice Morandi, MD   Patient's Medications  New Prescriptions   No medications on file  Previous Medications   ALPRAZOLAM (XANAX) 0.25 MG TABLET    TAKE 1 TABLET BY MOUTH EVERY 8 HOURS AS NEEDED   AMLODIPINE (NORVASC) 5 MG TABLET    Take 1 tablet (5 mg total) by mouth daily.   CHOLECALCIFEROL (VITAMIN D) 2000 UNITS TABLET    TAKE 1 TABLET (2,000 UNITS TOTAL) BY MOUTH DAILY.  FLUOXETINE (PROZAC) 10 MG CAPSULE    TAKE 1 CAPSULE (10 MG TOTAL) BY MOUTH DAILY.   LISINOPRIL (PRINIVIL,ZESTRIL) 40 MG TABLET    TAKE 1 TABLET (40 MG TOTAL) BY MOUTH DAILY.   METOPROLOL SUCCINATE (TOPROL-XL) 50 MG 24 HR TABLET    Take 1 tablet (50 mg total) by mouth daily. Take with or immediately following a meal.   MONTELUKAST (SINGULAIR) 10 MG TABLET    TAKE 1 TABLET (10 MG TOTAL) BY MOUTH AT BEDTIME.   PANTOPRAZOLE (PROTONIX) 40 MG TABLET    Take 1 tablet (40 mg total) by mouth  daily.   PREDNISONE (DELTASONE) 1 MG TABLET    TAKE 3 TABLETS BY MOUTH EVERY DAY  Modified Medications   No medications on file  Discontinued Medications   No medications on file

## 2014-05-21 NOTE — Telephone Encounter (Signed)
Amber with [facility] [place] called report; neg DVT but superficial thrombus mid thigh to distal calf. Dr Lorelei Pont notified and will speak with Amber.

## 2014-05-22 ENCOUNTER — Encounter: Payer: Self-pay | Admitting: Family Medicine

## 2014-05-22 NOTE — Telephone Encounter (Signed)
reviewed

## 2014-05-23 LAB — URINE CULTURE: Colony Count: 40000

## 2014-06-09 ENCOUNTER — Other Ambulatory Visit: Payer: Self-pay | Admitting: Family Medicine

## 2014-06-10 NOTE — Telephone Encounter (Signed)
Last office visit 05/21/2014.  Last refilled 02/17/2014 for #90 with 3 refills.  Ok to refill?

## 2014-06-19 ENCOUNTER — Other Ambulatory Visit: Payer: Self-pay | Admitting: Family Medicine

## 2014-09-01 ENCOUNTER — Other Ambulatory Visit: Payer: Self-pay | Admitting: Family Medicine

## 2014-10-06 ENCOUNTER — Ambulatory Visit: Payer: Medicare Other | Admitting: Family Medicine

## 2014-10-08 ENCOUNTER — Ambulatory Visit: Payer: Medicare Other | Admitting: Family Medicine

## 2014-10-08 IMAGING — CT CT ABD-PELV W/ CM
2 series · 11 of 31 positions shown, 12 images · non-contrast
Comparison: none

REASON FOR EXAM: (1) RLQ,LLQ pain; (2) RLQ,LLQ Pain
COMMENTS:   LMP: Post Hysterectomy

[Series 4: soft tissue · axial · 0.65mm/px · z∈[-719,-389]mm · 8 of 136 slices shown]
[im 13/136  mediastinal]
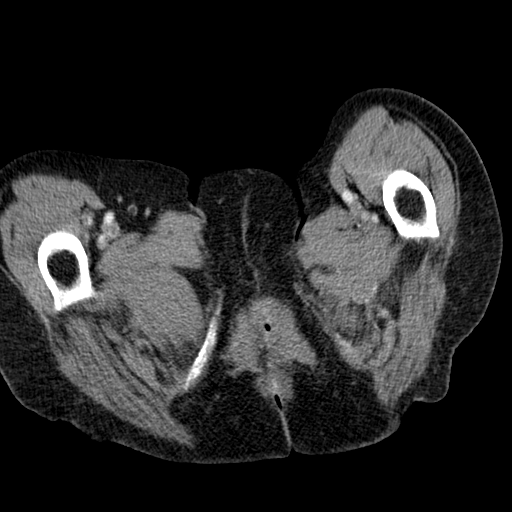
[im 31/136  mediastinal]
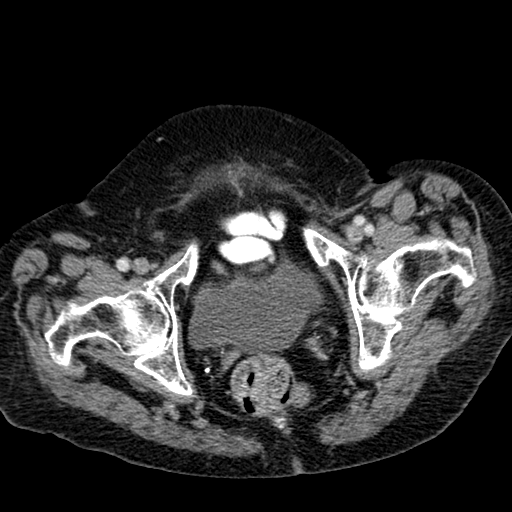
[im 43/136  mediastinal]
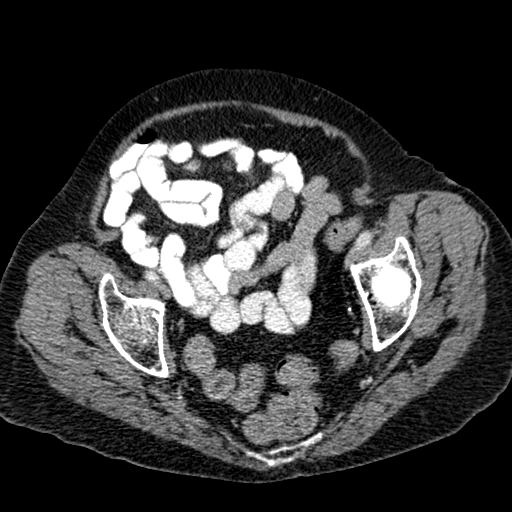
[im 62/136  mediastinal]
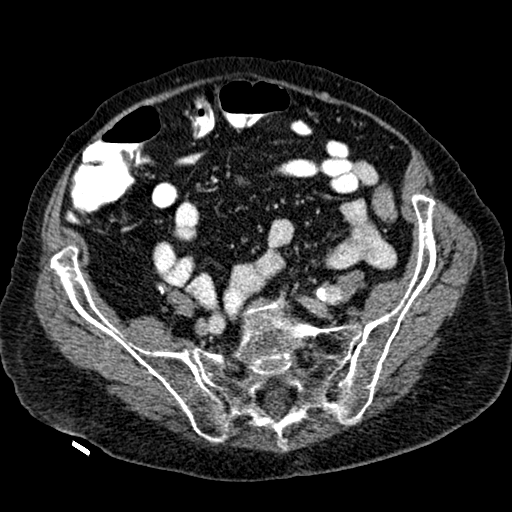
[im 74/136  mediastinal]
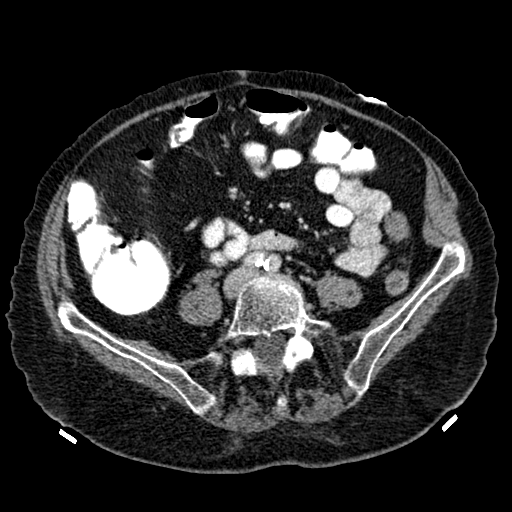
[im 93/136  mediastinal]
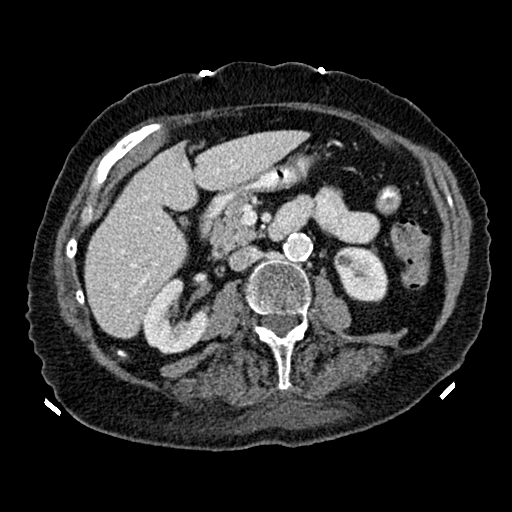
[im 105/136  mediastinal]
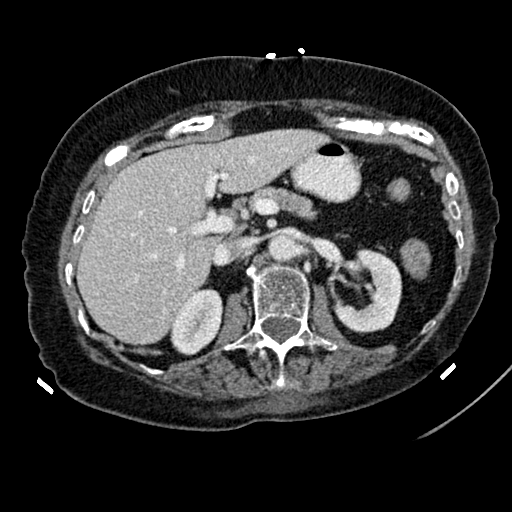
[im 123/136  mediastinal]
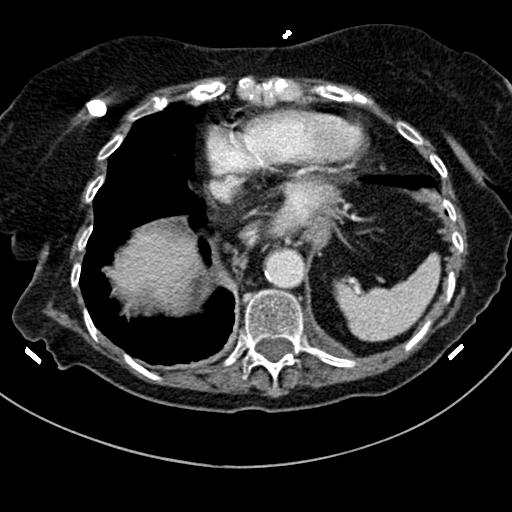

[Series 5: lung windows · axial · 0.65mm/px · z∈[-422,-374]mm · 3 of 33 slices shown, 4 images]
[im 9/33  mediastinal]
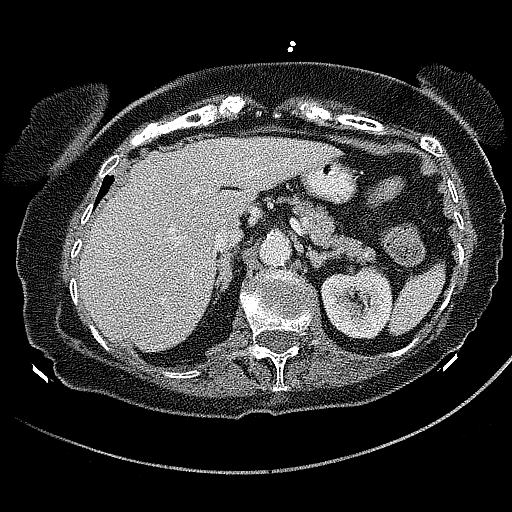
[im 9/33  lung]
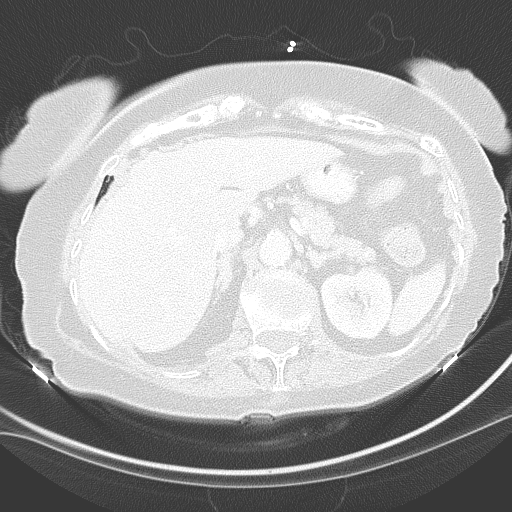
[im 17/33  lung]
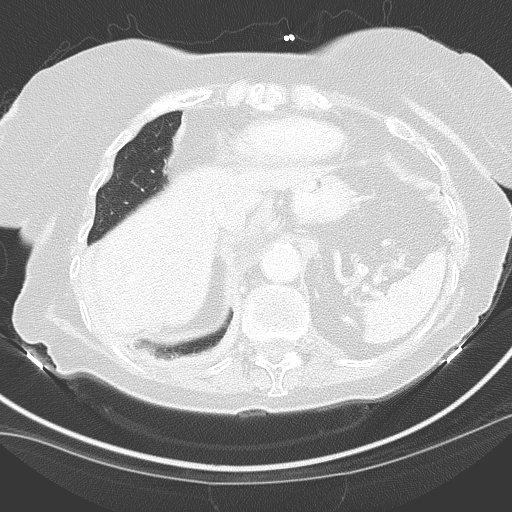
[im 25/33  lung]
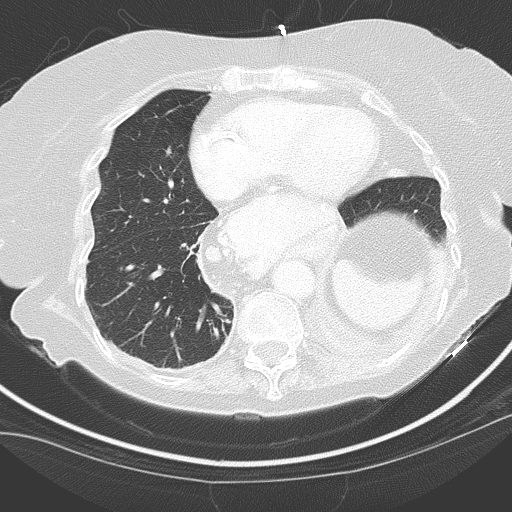

[11 of 31 positions shown; findings below may reference images not displayed]

PROCEDURE:     CT  - CT ABDOMEN / PELVIS  W  - December 24, 2012  [DATE]

RESULT:     Axial CT scanning was performed through the abdomen and pelvis
with reconstructions at 3 mm intervals and slice thicknesses following
intravenous administration of 85 cc of [id number]-[id number]. The patient received oral
contrast as well. Review of multiplanar reconstructed images was performed
separately on the VIA monitor.

There is a large hiatal hernia/partially intrathoracic stomach. The liver
exhibits no focal mass or ductal dilation. The gallbladder is adequately
distended with no evidence of stones or wall thickening or pericholecystic
fluid. The pancreas, spleen, partially distended stomach, adrenal glands,
and kidneys exhibit no acute abnormality. There is likely a 9 mm diameter
cyst in the lower pole of the left kidney with Hounsfield measurement of 21.

The caliber of the abdominal aorta exhibits mild failure to taper with
maximal diameter of 1.9 cm. There is no periaortic or pericaval
lymphadenopathy. The partially contrast-filled loops of small and large
bowel exhibit no evidence of ileus nor of obstruction. The cecum is somewhat
anteriorly positioned. The terminal ileum is normal in appearance. The
appendix is not discretely identified.

Within the pelvis the uterus is surgically absent. The partially distended
urinary bladder is normal in appearance. The rectosigmoid colon appears
normal. There is a small right inguinal hernia containing fat. There is no
significant umbilical hernia.

There is a compression of the body of L1 predominantly along its superior
surface with loss of height of approximately 20% anteriorly and 10%
posteriorly. There is minimal retropulsion of bone but there is no
significant spinal canal stenosis. The lung bases exhibit emphysematous
changes. There is a small left pleural effusion.
IMPRESSION: 1. The appearance of the small and large bowel does not suggest
diverticulitis or colitis or enteritis. No mass or obstructive processes are
 demonstrated. Portions of the left colon are noncontrast filled.
2. There is no evidence of urinary tract stones nor obstruction.
3. There is no acute hepatobiliary abnormality.
4. There is no intra-abdominal or pelvic lymphadenopathy.
5. There is partial compression of the body of L1 as described without
evidence of high-grade spinal canal stenosis.
6. There are emphysematous changes in both lungs. There is a small left
pleural effusion layering posteriorly.

[REDACTED]

## 2014-10-09 IMAGING — US US EXTREM LOW VENOUS BILAT
1 series · 14 of 24 positions shown · non-contrast
Comparison: none

REASON FOR EXAM: hx dvt, GI bleed on coumadin, assess if clot still there
COMMENTS:

[Series 1: us extrem low venous bilat · 0.09mm/px · 14 of 47 slices shown]
[im 1/47]
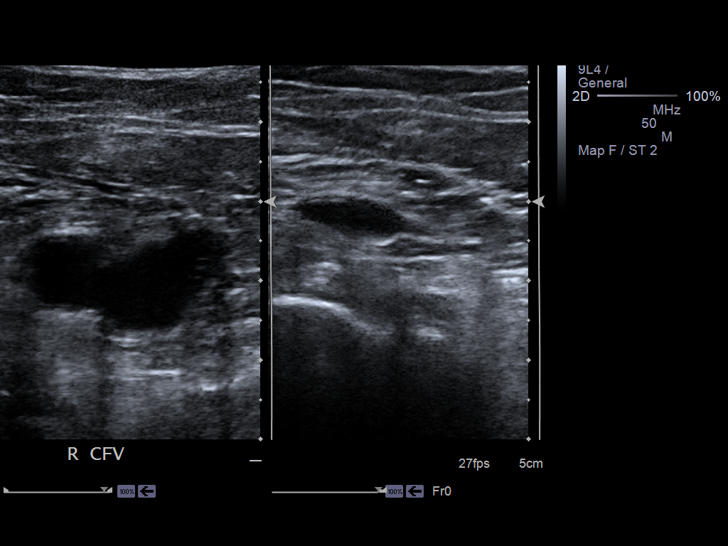
[im 5/47]
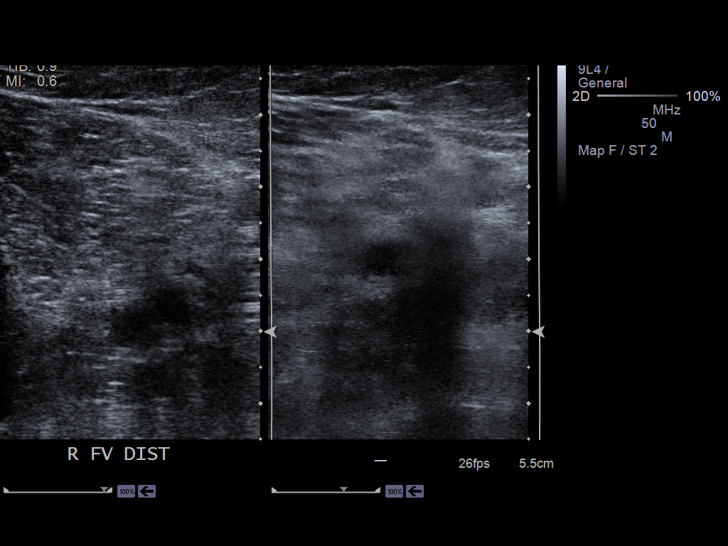
[im 9/47]
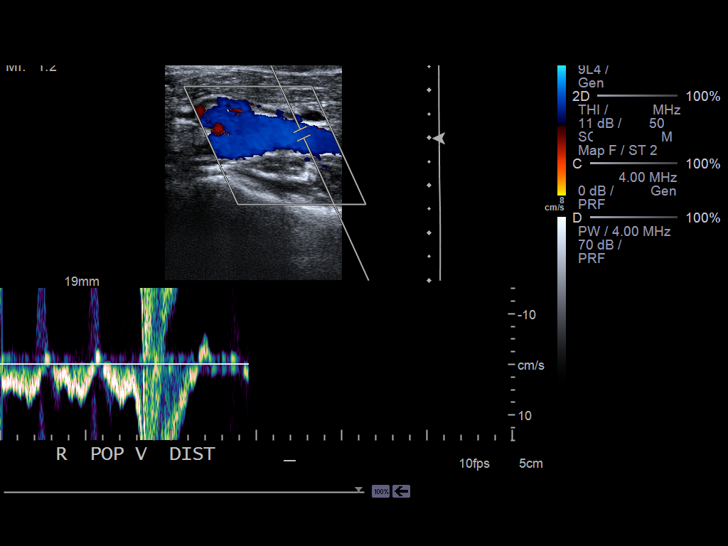
[im 13/47]
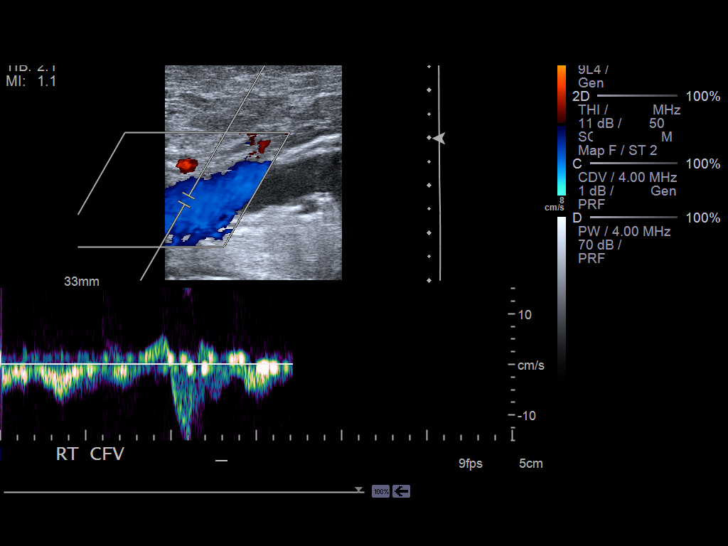
[im 15/47]
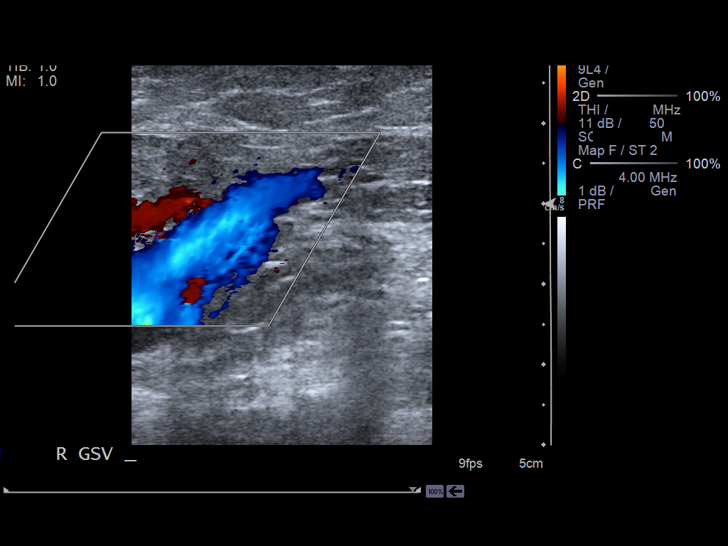
[im 19/47]
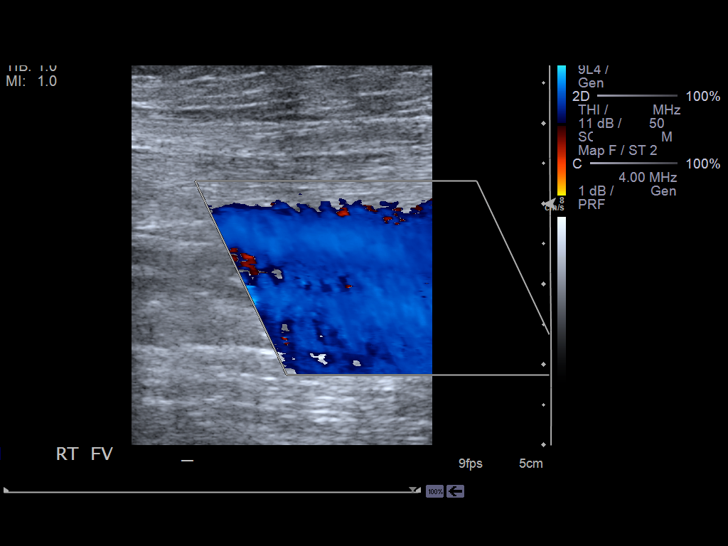
[im 23/47]
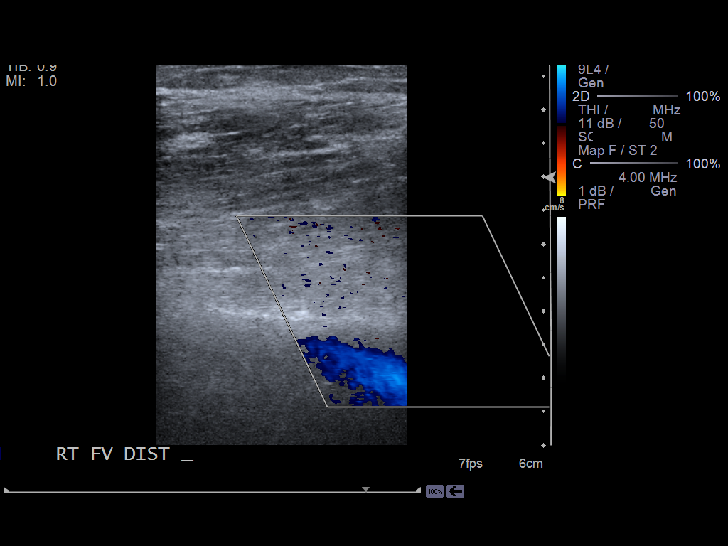
[im 25/47]
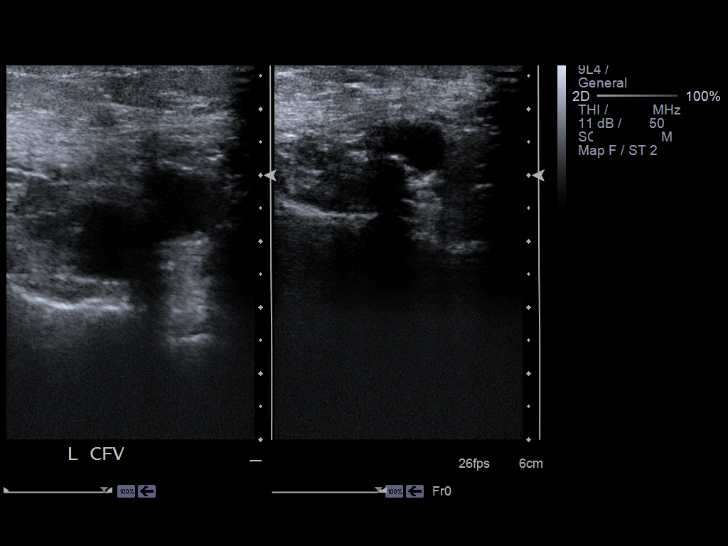
[im 29/47]
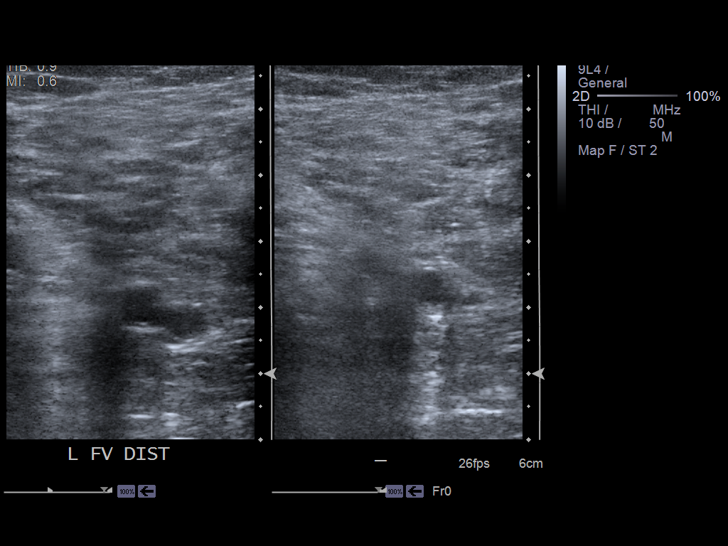
[im 33/47]
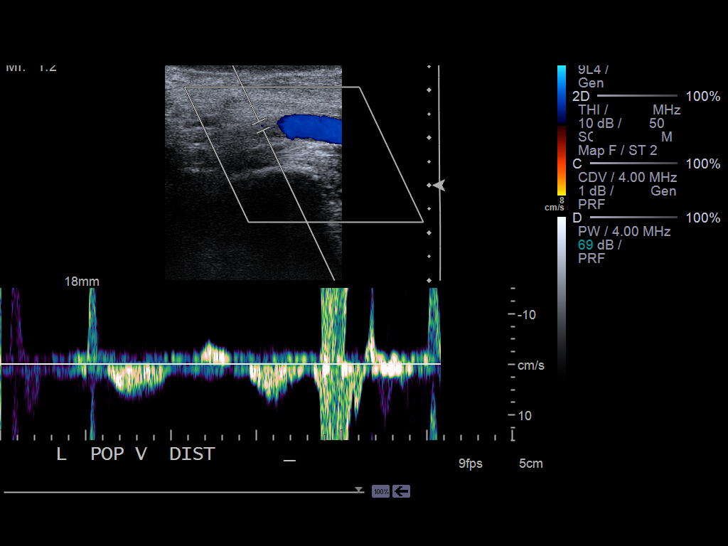
[im 37/47]
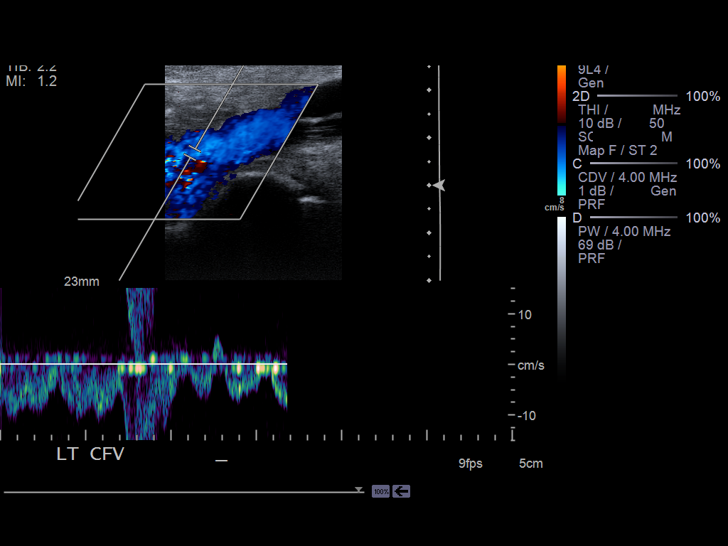
[im 39/47]
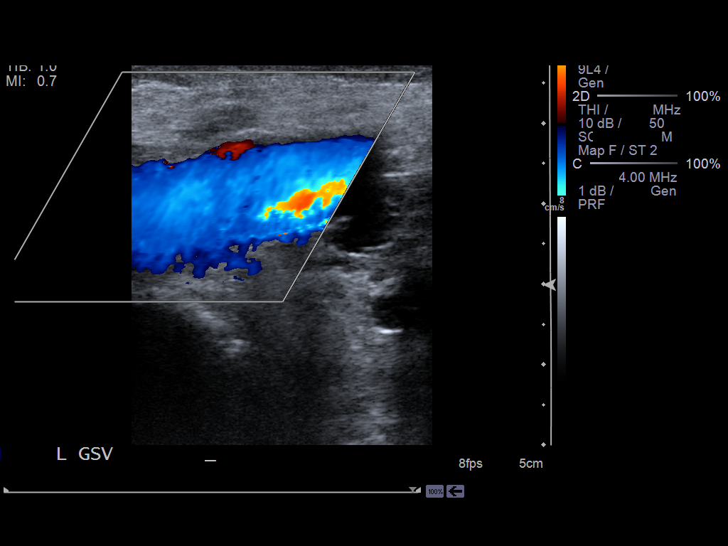
[im 43/47]
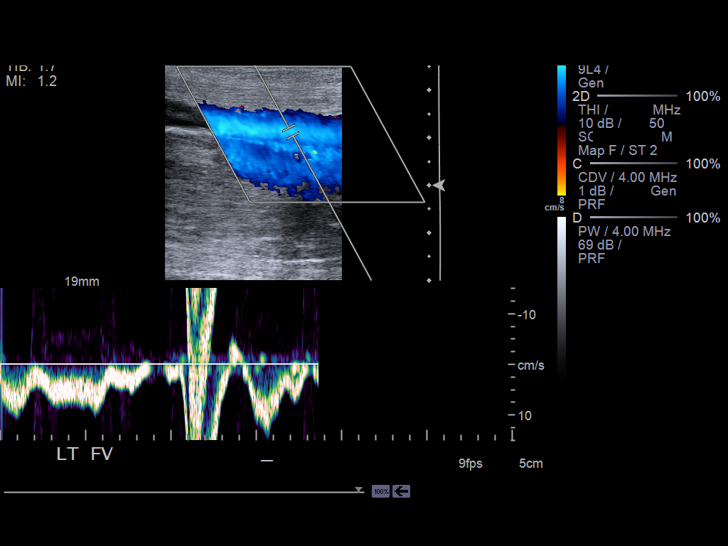
[im 47/47]
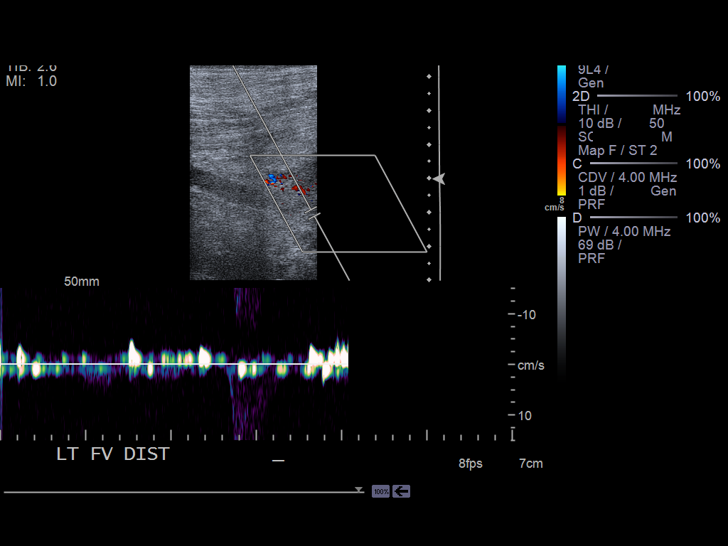

[14 of 24 positions shown; findings below may reference images not displayed]

PROCEDURE:     US  - US DOPPLER LOW EXTR BILATERAL  - December 25, 2012 [DATE]

RESULT:     Grayscale and color flow Doppler techniques were employed to
evaluate the deep veins of the right and left lower tremor these.

The right and left common femoral, superficial femoral, and popliteal veins
are normally compressible. The waveform patterns are normal and the color
flow images are normal. The response to the augmentation and Valsalva
maneuvers is normal.
IMPRESSION: There is no evidence of thrombus within the right or left
femoral or popliteal veins.

[REDACTED]

## 2014-11-01 ENCOUNTER — Other Ambulatory Visit: Payer: Self-pay | Admitting: Family Medicine

## 2014-11-05 ENCOUNTER — Other Ambulatory Visit: Payer: Self-pay | Admitting: Family Medicine

## 2014-11-28 NOTE — Consult Note (Signed)
Brief Consult Note: Diagnosis: GI bleed, hx of left leg DVT.   Patient was seen by consultant.   Comments: The patient now has a significant GI bleed and therefore requires cessation of her anticoagulation.  SHe has had only one DVT 6 years ago and has been on Coumadin since that time.  Therefore her DVT has been adequately treated.  At this point it appears she is cancer free and she does not have a documented history of hypercoagulable disorder (per the daughter).    No need for a filter at this time.  Furthermore, I would not restart the her Coumadin in the future  She should follow up in the office for education regarding graduated compression socks and also I feel that a lymph pump for home use would be very helpful given her history of leg inguinal lymph node dissection.  Electronic Signatures: Hortencia Pilar (MD)  (Signed 20-May-14 19:32)  Authored: Brief Consult Note   Last Updated: 20-May-14 19:32 by Hortencia Pilar (MD)

## 2014-11-28 NOTE — Consult Note (Signed)
PATIENT NAME:  Robin [name], Robin [name] MR#:  [id number] DATE OF BIRTH:  1925-11-21  GASTROENTEROLOGY CONSULTATION REPORT  DATE OF CONSULTATION:  12/25/2012  REQUESTING PHYSICIAN:  Dr. Vianne Bulls.  PRIMARY GASTROENTEROLOGIST:  Dr. Lucilla Lame.  PRIMARY CARE PHYSICIAN: Dr. Bea Graff, in [place], [place].  REASON FOR CONSULTATION:  Melena.   HISTORY OF PRESENT ILLNESS: Robin [name] is an 81 year old Caucasian female who was in her usual state of health until 2 days ago when she went to the bathroom and had dark stool. Then yesterday, she noticed that the stool was black and tarry. She does take stool softeners and Mylanta as needed for chronic constipation. She has a history of chronic daily heartburn and indigestion, and has not been taking a PPI. She has been on prednisone and Coumadin for  chronic DVT. She had a left DVT in 2008. Hemoglobin was 14.1 yesterday on admission. It is down to 11.8 today. She had a leukocytosis, with a white blood cell count of 23, down to 20.2 today; platelet count 452, down to 415 today. INR was 1.9 yesterday and now 2.3 today. She was given vitamin K yesterday and another 10 mg of vitamin K p.o. has been ordered today.   She had a CT scan of the abdomen and pelvis with contrast which showed a partial compression of L1, without  high-grade spinal canal stenosis, and emphysematous changes in both lungs, with a small left pleural effusion posteriorly. There was no acute intraabdominal process.   She has been started on a Protonix drip as well Flagyl 500 mg q. 8. She is also on Levophed.   PAST MEDICAL, SURGICAL HISTORY: Chronic GERD. remote peptic ulcer disease  30 years ago, last colonoscopy 2010; it was so long, but daughter reports it was normal. She had a previous colonoscopy in 2008 in [place] which was normal. She has early stages of Alzheimer's, asthma, vulvar cancer with recurrence in 2010, currently in remission; osteoporosis and osteoarthritis, chronic back pain,  recent cystoscopy, benign; complete hysterectomy, axillary nodule, benign; partial thyroidectomy, compression fracture of her L.  MEDICATIONS PRIOR TO ADMISSION: Dulcolax stool softeners p.r.n., Mylanta p.r.n., amlodipine 2.5 mg daily, hydrochlorothiazide/lisinopril 12.5/20 mg daily, Montelukast 10 mg daily, prednisone 5 mg daily for the last approximately 18 months, Tylenol 500 mg daily, Coumadin 5 mg, one-half tablet Monday, Wednesday, Friday, 5 mg, 1 tablet on Saturday, Sunday, Tuesday, Thursday.   ALLERGIES: LATEX, causes hives. SULFA, causes hives.   FAMILY HISTORY: Positive for father with colon cancer, diagnosed at age 83. Mother had a history of congestive heart failure. She lost one brother with renal cancer.   SOCIAL HISTORY: She resides with her daughter. She lost one daughter in a motor vehicle accident. She moved here 2 years ago from [place].   She denies any tobacco, alcohol or drug use.   REVIEW OF SYSTEMS: See HPI.  CONSTITUTIONAL: She has had some fatigue and malaise.  MUSCULOSKELETAL: She had chronic low back pain and needs assistance with a lift from bed to chair. GENITOURINARY: She has hematuria and had a cystoscopy 3 months ago which was benign. She was sent to a hematologist had a negative workup, per her daughter.   Otherwise, negative complete review of systems.   PHYSICAL EXAM:  VITAL SIGNS: Temp 97.6, pulse 68, respirations 20, blood pressure 126/41.  GENERAL: She is an alert, oriented, pleasant, cooperative, elderly Caucasian female in no acute distress. Her daughter is at the bedside. She is somewhat hard of hearing.  HEENT: Sclerae clear, nonicteric.  Conjunctivae pink. Oropharynx pink and moist. She does have an upper partial.  NECK: Is supple, without mass, thyromegaly.  CHEST: Heart regular rate and rhythm. Normal S1, S2. No murmurs, rubs or gallops.  LUNGS: Clear to auscultation bilaterally.  ABDOMEN: Positive bowel sounds x 4. No bruits auscultated. Soft,  nontender, nondistended, without mass or hepatosplenomegaly. No rebound tenderness or guarding.  EXTREMITIES: Without clubbing or edema bilaterally.  SKIN: Pink, warm, and dry, without any rash or jaundice.   LABORATORY STUDIES: Glucose 110, BUN 33, sodium 130, chloride 95, otherwise normal BMP. Serum albumin 2.9. Otherwise normal hepatic function panel.  Troponin negative. Hemoglobin 11.8, hematocrit 33.9, white blood cell count 20.2 and platelets 415. INR 2.3, PTT 24.4.   Blood culture: No-growth in 18 to 24 hours.   IMPRESSION: Robin [name] is a pleasant 81 year old female with melena, on Coumadin for deep vein thrombosis since 2008, and recent prednisone over the last year and a half. She has chronic gastroesophageal reflux disease and was not on a proton pump inhibitor. Her hemoglobin is 11.8, today, down from 14.1 yesterday. Vitamin K has ordered by attending yesterday as well as this morning.   She will need an esophagogastroduodenoscopy with biopsies tomorrow once INR is to around  1.5 to look for upper gastrointestinal bleed, likely peptic ulcer disease or gastritis. Cannot rule out small bowel source at this time.   I have discussed risks and benefits which include, but are not limited to: Bleeding, infection, perforation, and drug reaction. She agrees with plan and consent will be obtained. Her daughter is at the bedside and agrees as well. This plan will be discussed with Dr. Lucilla Lame.   PLAN: 1.  EGD tomorrow with Dr. Allen Norris.  2.  N.p.o. after midnight.  3.  Agree with Protonix and drip.  4.  Agree with vitamin K, 10 mg today.  5.  Continue to follow her H and H.   We would like to thank you for allowing [place] participate in the care of Robin [name].     ____________________________ Andria Meuse, NP klj:dm D: 12/25/2012 09:17:04 ET T: 12/25/2012 09:50:56 ET JOB#: [id number]  cc: Andria Meuse, NP, <Dictator> Raina Mina, MD  ([place]) [place] SIGNED 01/02/2013 18:12

## 2014-11-28 NOTE — Discharge Summary (Signed)
PATIENT NAME:  Robin [name], Robin [name] MR#:  [id number] DATE OF BIRTH:  04-22-1926  DATE OF ADMISSION:  12/24/2012 DATE OF DISCHARGE:  12/28/2012   ADMITTING DIAGNOSIS: Black tarry stools.   DISCHARGE DIAGNOSES:  1. Black tarry stools due to acute gastrointestinal bleeding as a result of severe esophagitis.  2. Generalized weakness and deconditioning due to chronic spine fractures.  3. Hypotension, again likely related to gastrointestinal bleed. The patient's blood pressure is stable. She is off her antihypertensives.  4. History of deep venous thrombosis, on Coumadin since 2008. Had coagulopathy on presentation. Given vitamin K. Her Coumadin has been discontinued. Lower extremity Doppler was negative for deep vein thrombosis.  5. Leukocytosis, felt to be reactive.  6. Compression fracture with pain control.  7. Chronic steroids.  8. History of gastroesophageal reflux disease.  9. History of vulvar cancer, status post surgery.  10. Osteoporosis.   11. Inability to tolerate bisphosphonates.  12. Status post partial hysterectomy.  13. History of tumor removal from her axilla.  14. Status post thyroid surgery.   CONSULTANTS: GI, Dr. Allen Norris.   LABORATORY DATA: Admitting labs: Glucose 110, BUN 33, creatinine 0.80, sodium 130, potassium 3.6, chloride 95, CO2 was 28. LFTs were normal except albumin of 2.9. Troponin less than 0.02. WBC count was 23, hemoglobin 14.1. Hemoglobin remained stable, most recent hemoglobin on May 22nd was 12.4. EGD showed severe esophagitis. No active bleeding was noted. Gastritis, hiatal hernia were also noted.   HOSPITAL COURSE: Please refer to H and P done by the admitting physician. The patient is an 81 year old white female who has a history of GERD. Has history of DVT remotely in the past, asthma, is on chronic Coumadin. Unable to tolerate bisphosphonates. Presented to the ED with complaint of dark-colored stools, generalized weakness. The patient was noted to have an upper  GI bleed. She was admitted and placed on IV Protonix. She was seen in consultation by GI and underwent EGD, which showed severe esophagitis. The patient was started on PPIs b.i.d. Has not had any further dark-colored stools. Her hemoglobin has remained stable. In addition, the patient was also having hypotension on presentation. Her antihypertensive has been stopped. Blood pressure has been stable. She also has chronic back pain due to spine fractures. She was seen in consultation by PT. They recommended that she have skilled nursing facility for rehab for short term. At this time, arrangements are being made for her to go to rehab. She is currently stable for discharge.   DISCHARGE MEDICATIONS:  1. Montelukast 10 mg 1 tab p.o. daily.  2. Prednisone 5 daily.  3. Tylenol 500 mg 1 tab p.o. daily as needed for pain.  4. Acetaminophen/oxycodone 325/5 q.6 p.r.n. pain.  5. Alprazolam 0.25 q.8 p.r.n. for anxiety. 6. Protonix 40 mg 1 tab p.o. b.i.d.  7. Calcium plus vitamin D 1 tab p.o. t.i.d.   HOME OXYGEN: None.   DIET: Low sodium, low residual.   ACTIVITY: As tolerated with a walker. The patient needs further rehab and PT.   FOLLOWUP: With primary MD in 1 to 2 weeks.   TIME SPENT: 45 minutes spent on the discharge.   ____________________________ Lafonda Mosses. Posey Pronto, MD shp:OSi D: 12/28/2012 12:31:05 ET T: 12/28/2012 12:42:56 ET JOB#: [id number]  cc: [name], MD, <Dictator> Alric Seton MD ELECTRONICALLY SIGNED 01/03/2013 14:50

## 2014-11-28 NOTE — H&P (Signed)
PATIENT NAME:  Robin [name], Robin [name] MR#:  [id number] DATE OF BIRTH:  28-Aug-1925  DATE OF ADMISSION:  12/24/2012  PRIMARY CARE PHYSICIAN: From out of area.  The patient's primary doctor is in [place].   ER PHYSICIAN: Arman Filter, MD   CHIEF COMPLAINT: Black stool.   HISTORY OF PRESENT ILLNESS: The patient is an 81 year old female patient with history of DVT, on Coumadin, who came in because of the black stool.  Started this morning. She had about 2 episodes of diarrhea with black stool.  No fresh blood.  The patient's daughter is with the patient.  Yesterday she was feeling a little weak and this morning she really had black stool and dizziness. According to the daughter, Coumadin was on hold Thursday and Friday because of INR of 3 and restarted the Coumadin Saturday and Sunday. Usually the patient is on Coumadin, 2.5 mg, Monday, Wednesday, and Friday, and 5 mg other days. She took 5 mg last night. The patient does not use ibuprofen or Aleve on a regular basis. The patient complains of mild right lower quadrant abdominal pain. Denies any nausea or vomiting and no fever.   PAST MEDICAL HISTORY:  Significant for asthma, GERD, and deep vein thromboses. She has been on Coumadin for the past 10 years. She has history of vulvar cancer status post surgery, osteoporosis, severe back pain, and risk for multiple fractures because she has GERD and unable to tolerate bisphosphonates. She did not take any Fosamax or Boniva because of her severe acid reflux.   SOCIAL HISTORY: No smoking, no drinking, no drugs. Used to live in [place], in independent living facility, but recently moved in with daughter, but sees the primary doctor in [place] every 3 months.   PAST SURGICAL HISTORY: Surgery for vulvar cancer. She had 2 surgeries for vulvar cancer.  History of partial hysterectomy, history of tumors removed from axilla, thyroid surgery, and the patient had a colonoscopy multiple times in preparation for her vulvar  cancer surgery. According to the patient and the patient's daughter, colonoscopies were normal. She never had EGD.   MEDICATIONS: 1.  Amlodipine 2.5 mg daily. 2.  HCTZ/lisinopril combination 12.5/20 mg p.o. daily. 3.  Montelukast 10 mg p.o. daily.  4.  Prednisone 5 mg p.o. daily. The patient is on prednisone for almost 1-1/2 years.  5.  Tylenol 500 mg p.o. daily. 6.  Coumadin 5 mg 1/2 tablet Monday, Wednesday, and Friday and 5 mg on Saturday and Sunday and also Tuesday and Thursday.   FAMILY HISTORY: No hypertension or diabetes.   REVIEW OF SYSTEMS:  CONSTITUTIONAL: The patient has no fever, but does have some fatigue today.  EYES: No blurred vision.  ENT: No tinnitus. No epistaxis. No difficulty swallowing.  RESPIRATORY: The patient does not have any cough.  CARDIOVASCULAR: No chest pain. No orthopnea.  GASTROINTESTINAL: Has black stool today and mild abdominal pain. No hematemesis. No vomiting.  No constipation.  GENITOURINARY: No dysuria.  ENDOCRINE: No polyuria or nocturia.  HEMATOLOGIC: No anemia.  INTEGUMENTARY: No skin rashes.  MUSCULOSKELETAL: Has severe osteoporosis and chronic back pain. The patient is on prednisone for about a year given by her primary doctor to help her with back pain.  NEUROLOGICAL: Has no numbness or weakness in the legs.  PSYCH: No anxiety or insomnia.   PHYSICAL EXAMINATION: GENERAL: This is an 81 year old elderly female not in distress, well-nourished, well-developed. VITAL SIGNS:  Temperature 97.9, heart rate 89, blood pressure 118/60, and sats 97% on room air.  GENERAL:  She is alert, awake, and oriented.  Answering questions appropriately.  HEENT: Head atraumatic, normocephalic. Pupils equally reacting to light. Extraocular movements are intact. No conjunctival congestion. No scleral icterus. Hearing is intact. Tympanic membranes are clear. Pharynx no oropharyngeal erythema. Mucous membranes are relatively dry.  NECK: No thyroid enlargement. Supple.  No masses. No lymphadenopathy. No JVD. No carotid bruit.  LUNGS:  Clear to auscultation. No wheeze. No rales. Not using accessory muscles for respiration.  HEART:  S1 and S2 regular. PMI not displaced. The patient as no thrills. Good pedal pulse and good femoral pulse. No extremity edema.  ABDOMEN: Soft. Slightly distended. Bowel sounds present. No hernia.  MUSCULOSKELETAL: Strength 5 out of 5 in upper and lower extremities.  SKIN: No skin rashes.  NEUROLOGIC: Cranial nerves II through XII intact. DTRs 2+ bilaterally. No dysarthria or aphasia. PSYCH:  Oriented to time, place, and person, and very cooperative.  Judgment is good.  LABORATORY AND DIAGNOSTICS:  CT of the abdomen showed   hiatal hernia. Electrolytes:  Sodium 130, potassium 3.6, chloride 95, bicarbonate 28, BUN 33, creatinine 0.82, and blood glucose 110. CBC: WBC 23, hemoglobin 14.1, hematocrit 40.5, and platelets 452. INR is 1.9.  EKG shows normal sinus rhythm at 87 beats per minute and no ST-T changes.   ASSESSMENT AND PLAN:  1.  The patient is an 81 year old female patient with gastrointestinal bleed likely secondary to gastritis due to combination of gastroesophageal reflux disease and also anticoagulant use. The patient is going to be admitted to telemetry. at present  her hemoglobin is stable and blood pressure is stable. She will be monitored on tele with CBC q. 8 hours and continue the Protonix 40 mg IV q. 12 hours. Hold the Coumadin. INR is 1.9. We will give 1 dose of vitamin K. The patient will benefit from inferior vena cava filter for her deep vein thrombosis. 2.   Right leg deep vein thrombosis. On Coumadin chronically for 10 years. She will get vascular surgery evaluation for possible filter placement.  3.  Hypertension. Blood pressure is stable. Hold HCTZ and lisinopril because of gastrointestinal bleed. Continue intravenous hydration.  4.  History of asthma. She is on montelukast and inhalers. Continue montelukast and add  inhalers as needed.   5.  Leukocytosis. Probably reaactive.  Also could be due to the prednisone. Check the CBC in the morning, follow the cultures.  No need for antibiotics at this time. 6.  History of gastroesophageal reflux disease and history of hiatal hernia. She is on proton pump inhibitors anyway.    TIME SPENT ON HISTORY AND PHYSICAL: About 60 minutes.  ____________________________ Epifanio Lesches, MD sk:sb D: 12/24/2012 15:29:08 ET T: 12/24/2012 15:57:34 ET JOB#: [id number]  cc: Epifanio Lesches, MD, <Dictator> Epifanio Lesches MD ELECTRONICALLY SIGNED 01/13/2013 13:19

## 2014-11-28 NOTE — Consult Note (Signed)
Chief Complaint:  Subjective/Chief Complaint Pt feels well.  Denies any melena, nausea, vomiting or abd pain.  Tolerating clear iquids well.  Wants to eat.  Hgb 10. INR 1.3.   VITAL SIGNS/ANCILLARY NOTES: **Vital Signs.:   22-May-14 07:00  Vital Signs Type Routine  Temperature Temperature (F) 97.7  Celsius 36.5  Temperature Source oral  Pulse Pulse 56  Respirations Respirations 13  Systolic BP Systolic BP 99  Diastolic BP (mmHg) Diastolic BP (mmHg) 48  Mean BP 65  BP Source  if not from Vital Sign Device non-invasive  Pulse Ox % Pulse Ox % 97  Pulse Ox Activity Level  At rest  Oxygen Delivery Room Air/ 21 %  Pulse Ox Heart Rate 56   Brief Assessment:  Cardiac Regular   Respiratory normal resp effort   Gastrointestinal Normal   Gastrointestinal details normal Soft  Nontender  Nondistended  Bowel sounds normal  No rebound tenderness   Additional Physical Exam GEN: A/Ox3 NAD. Daughter at bedside. Skin: Pink, warm, dry.   Lab Results: Routine Coag:  22-May-14 04:12   Prothrombin  16.3  INR 1.3 (INR reference interval applies to patients on anticoagulant therapy. A single INR therapeutic range for coumarins is not optimal for all indications; however, the suggested range for most indications is 2.0 - 3.0. Exceptions to the INR Reference Range may include: Prosthetic heart valves, acute myocardial infarction, prevention of myocardial infarction, and combinations of aspirin and anticoagulant. The need for a higher or lower target INR must be assessed individually. Reference: The Pharmacology and Management of the Vitamin K  antagonists: the seventh ACCP Conference on Antithrombotic and Thrombolytic Therapy. SWFUX.3235 Sept:126 (3suppl): [id number]. A HCT value >55% may artifactually increase the PT.  In one study,  the increase was an average of 25%. Reference:  "Effect on Routine and Special Coagulation Testing Values of Citrate Anticoagulant Adjustment in Patients with  High HCT Values." American Journal of Clinical Pathology 2006;126:400-405.)  Routine Hem:  22-May-14 04:12   WBC (CBC) 10.2  RBC (CBC)  3.19  Hemoglobin (CBC)  10.0  Hematocrit (CBC)  28.8  Platelet Count (CBC) 287  MCV 90  MCH 31.4  MCHC 34.7  RDW 12.4  Neutrophil % 81.8  Lymphocyte % 11.8  Monocyte % 5.7  Eosinophil % 0.1  Basophil % 0.6  Neutrophil #  8.4  Lymphocyte # 1.2  Monocyte # 0.6  Eosinophil # 0.0  Basophil # 0.1 (Result(s) reported on 27 Dec 2012 at 04:27AM.)   Assessment/Plan:  Assessment/Plan:  Assessment Severe erosive esophagitis: Improved Melena: Resolved   Plan 1) Change to BID PPI PO 2) Advance to soft diet as tolerated 3) Follow up office visit in 4 weeks or sooner if needed   Electronic Signatures: Andria Meuse (NP)  (Signed 22-May-14 10:59)  Authored: Chief Complaint, VITAL SIGNS/ANCILLARY NOTES, Brief Assessment, Lab Results, Assessment/Plan   Last Updated: 22-May-14 10:59 by Andria Meuse (NP)

## 2014-11-28 NOTE — Consult Note (Signed)
Brief Consult Note: Diagnosis: melena, GI bleed.   Patient was seen by consultant.   Consult note dictated.   Orders entered.   Discussed with Attending MD.   Comments: Robin [name] is a pleasant 81 y/o female with melena on coumadin for DVT 2008 & prednisone.  She has chronic GERD & was not on PPI.  Hgb 11.8 today, down from 14.1 yesterday.  Vit K has been ordered by attending.  She will need EGD with possible biopsies tomorrow once INR around 1.5 to look for UGI bleed, likely PUD or gastritis.  Cannot r/o small bowel source.  I have discussed risk/benefits w/ pt & daughter & they agree with plans.  Will discuss w/ Dr Allen Norris.  Plan: 1) EGD tomorrow with Dr Allen Norris 2) NPO after MN 3) Agree w/ protonix gtt 4) Agree w/ Vit K today 5) Follow H/H   Thanks for allowing [place] to participate in her care. #[phone].  Electronic Signatures: Andria Meuse (NP)  (Signed 20-May-14 09:17)  Authored: Brief Consult Note   Last Updated: 20-May-14 09:17 by Andria Meuse (NP)

## 2014-11-28 NOTE — Consult Note (Signed)
Admit Diagnosis:   GI BLEED WEAKNESS: Onset Date: 27-Dec-2012, Status: Active, Description: GI BLEED WEAKNESS   General Aspect GI bleed with history of DVT   Present Illness The patient is an 81 year old female patient with history of DVT, on Coumadin, who came in because of the black stool.  Yesterday she was feeling a little weak and this morning she really had black stool and dizziness. According to the daughter, Coumadin was on hold Thursday and Friday because of INR of 3 and restarted the Coumadin Saturday and Sunday. Usually the patient is on Coumadin, 2.5 mg, Monday, Wednesday, and Friday, and 5 mg other days. She took 5 mg last night.  The patient complains of mild right lower quadrant abdominal pain. Denies any nausea or vomiting and no fever.  Her DVT was an isolated episode and she has been on Coumadin since.  No PE no documented Hypercoagulable state.  PAST MEDICAL HISTORY:  Significant for asthma, GERD, and deep vein thromboses. She has been on Coumadin for the past 10 years. She has history of vulvar cancer status post surgery, osteoporosis, severe back pain, and risk for multiple fractures because she has GERD and unable to tolerate bisphosphonates. She did not take any Fosamax or Boniva because of her severe acid reflux.   Home Medications: Medication Instructions Status  Calcium 600+D 600 mg-200 units oral tablet 1 tab(s) orally 3 times a day Active  acetaminophen-oxyCODONE 325 mg-5 mg oral tablet 1 tab(s) orally every 6 hours, As Needed, pain , As needed, pain Active  ALPRAZolam 0.25 mg oral tablet 1 tab(s) orally every 8 hours, As Needed, anxiety , As needed, anxiety Active  pantoprazole 40 mg oral delayed release tablet 1 tab(s) orally 2 times a day Active  montelukast 10 mg oral tablet 1 tab(s) orally once a day (in the evening) Active  predniSONE 5 mg oral tablet 1 tab(s) orally once a day Active  Tylenol 500 mg oral tablet 1 tab(s) orally once a day, As Needed - for  Pain Active    Sulfa drugs: Hives  Latex: Hives  Case History:  Family History Non-Contributory   Social History negative tobacco, negative ETOH, negative Illicit drugs   Review of Systems:  Fever/Chills No   Cough No   Sputum No   Abdominal Pain No   Diarrhea No   Constipation No   Nausea/Vomiting No   SOB/DOE No   Chest Pain No   Telemetry Reviewed NSR   Physical Exam:  GEN well developed, well nourished, no acute distress   HEENT hearing intact to voice, moist oral mucosa   NECK supple  trachea midline   RESP normal resp effort  no use of accessory muscles   CARD regular rate  no JVD   ABD denies Flank Tenderness  nondistended   EXTR negative cyanosis/clubbing, negative edema   SKIN No rashes, No ulcers, skin turgor good   NEURO cranial nerves intact, motor/sensory function intact   PSYCH alert, good insight   Nursing/Ancillary Notes: **Vital Signs.:   20-May-14 00:00  Temperature Temperature (F) 98.1  Celsius 36.7  Temperature Source oral  Pulse Pulse 66  Respirations Respirations 18  Systolic BP Systolic BP 82  Diastolic BP (mmHg) Diastolic BP (mmHg) 38  Mean BP 52  Pulse Ox % Pulse Ox % 96  Oxygen Delivery 1L; Nasal Cannula  Pulse Ox Heart Rate 64   General Ref:  21-May-14 04:23   Cortisol, Serum RIA ========== TEST NAME ==========  ========= RESULTS =========  =  REFERENCE RANGE =  CORTISOL  Cortisol Cortisol                        [H  26.0 ug/dL           ]          2.3-19.4                                        Cortisol AM         6.2 - 19.4                                        Cortisol PM         2.3 - 11.9               [facility]            No: [id number]           [address], [place], [place] [address]           Lindon Romp, MD         [phone]   Result(s) reported on 27 Dec 2012 at 04:50AM.  Routine Chem:  21-May-14 04:23   Glucose, Serum 88  BUN 11  Creatinine (comp) 0.61  Sodium,  Serum  135  Potassium, Serum 3.7  Chloride, Serum 103  CO2, Serum 26  Calcium (Total), Serum  8.0  Anion Gap  6  Osmolality (calc) 269  eGFR (African American) >60  eGFR (Non-African American) >60 (eGFR values <69m/min/1.73 m2 may be an indication of chronic kidney disease (CKD). Calculated eGFR is useful in patients with stable renal function. The eGFR calculation will not be reliable in acutely ill patients when serum creatinine is changing rapidly. It is not useful in  patients on dialysis. The eGFR calculation may not be applicable to patients at the low and high extremes of body sizes, pregnant women, and vegetarians.)  Routine Coag:  21-May-14 04:23   Prothrombin  17.9  INR 1.5 (INR reference interval applies to patients on anticoagulant therapy. A single INR therapeutic range for coumarins is not optimal for all indications; however, the suggested range for most indications is 2.0 - 3.0. Exceptions to the INR Reference Range may include: Prosthetic heart valves, acute myocardial infarction, prevention of myocardial infarction, and combinations of aspirin and anticoagulant. The need for a higher or lower target INR must be assessed individually. Reference: The Pharmacology and Management of the Vitamin K  antagonists: the seventh ACCP Conference on Antithrombotic and Thrombolytic Therapy. CWIOMB.5597Sept:126 (3suppl): [id number] A HCT value >55% may artifactually increase the PT.  In one study,  the increase was an average of 25%. Reference:  "Effect on Routine and Special Coagulation Testing Values of Citrate Anticoagulant Adjustment in Patients with High HCT Values." American Journal of Clinical Pathology 2006;126:400-405.)  Routine Hem:  21-May-14 04:23   WBC (CBC) 10.7  RBC (CBC)  3.15  Hemoglobin (CBC)  10.0  Hematocrit (CBC)  28.7  Platelet Count (CBC) 270  MCV 91  MCH 31.7  MCHC 34.8  RDW 12.8  Neutrophil % 66.8  Lymphocyte % 19.8  Monocyte % 10.9   Eosinophil % 1.9  Basophil % 0.6  Neutrophil #  7.2  Lymphocyte # 2.1  Monocyte #  1.2  Eosinophil # 0.2  Basophil # 0.1 (Result(s) reported on 26 Dec 2012 at 04:50AM.)   CT:    19-May-14 14:34, CT Abdomen and Pelvis With Contrast  CT Abdomen and Pelvis With Contrast   REASON FOR EXAM:    (1) RLQ,LLQ pain; (2) RLQ,LLQ Pain  COMMENTS:   LMP: Post Hysterectomy    PROCEDURE: CT  - CT ABDOMEN / PELVIS  W  - Dec 24 2012  2:34PM     RESULT: Axial CT scanning was performed through the abdomen and pelvis   with reconstructions at 3 mm intervals and slice thicknesses following   intravenous administration of 85 cc of Isovue-300. The patient received   oral contrast as well. Review of multiplanar reconstructed images was   performed separately on the VIA monitor.    There is a large hiatal hernia/partially intrathoracic stomach. The liver   exhibits no focal mass or ductal dilation. The gallbladder is adequately   distended with no evidence of stones or wall thickening or   pericholecystic fluid. The pancreas, spleen, partially distended stomach,     adrenal glands, and kidneys exhibit no acute abnormality. There is likely   a 9 mm diameter cyst in the lower pole of the left kidney with Hounsfield   measurement of 21.    The caliber of the abdominal aorta exhibits mild failure to taper with   maximal diameter of 1.9 cm. There is no periaortic or pericaval   lymphadenopathy. The partially contrast-filled loops of small and large   bowel exhibit no evidence of ileus nor of obstruction. The cecum is   somewhat anteriorly positioned. The terminal ileum is normal in   appearance. The appendix is not discretely identified.     Within the pelvis the uterus is surgically absent. The partially   distended urinary bladder is normal in appearance. The rectosigmoid colon   appears normal. There is a small right inguinal hernia containing fat.   There is no significant umbilical hernia.  There  is a compression of the body of L1 predominantly along its superior   surface with loss of height of approximately 20% anteriorly and 10%   posteriorly. There is minimal retropulsion of bone but there is no   significant spinal canal stenosis. The lung bases exhibit emphysematous   changes. There is a small left pleural effusion.    IMPRESSION:   1. The appearance of thesmall and large bowel does not suggest   diverticulitis or colitis or enteritis. No mass or obstructive processes   are  demonstrated. Portions of the left colon are noncontrast filled.  2. There is no evidence of urinary tract stones nor obstruction.  3. There is no acute hepatobiliary abnormality.  4. There is no intra-abdominal or pelvic lymphadenopathy.  5. There is partial compression of the body of L1 as described without   evidence of high-grade spinal canal stenosis.  6. There are emphysematous changes in both lungs. There is a small left   pleural effusion layering posteriorly.     Dictation Site: 1        Verified By: DAVID A. Martinique, M.D., MD    Impression 1.    Right leg deep vein thrombosis. On Coumadin chronically for 10 years. The patient now has a significant GI bleed and therefore requires cessation of her anticoagulation.  SHe has had only one DVT 6 years ago and has been on Coumadin since that time.  Therefore her DVT has been adequately treated.  At this point it appears she is cancer free and she does not have a documented history of hypercoagulable disorder (per the daughter).    No need for a filter at this time.  Furthermore, I would not restart the her Coumadin in the future  She should follow up in the office for education regarding graduated compression socks and also I feel that a lymph pump for home use would be very helpful given her history of leg inguinal lymph node dissection.  2.    The patient is an 81 year old female patient with gastrointestinal bleed likely secondary to gastritis  due to combination of gastroesophageal reflux disease and also anticoagulant use. The patient is going to be admitted to telemetry. Directly her hemoglobin is stable and blood pressure is stable. She will be monitored on tele with CBC q. 8 hours and continue the Protonix 40 mg IV q. 12 hours. Hold the Coumadin. INR is 1.9. We will give 1 dose of vitamin K. The patient will benefit from inferior vena cava filter for her deep vein thrombosis. 3.  Hypertension. Blood pressure is stable. Hold HCTZ and lisinopril because of gastrointestinal bleed. Continue intravenous hydration.  4.  History of asthma. She is on montelukast and inhalers. Continue montelukast and add inhalers as needed.   5.  Leukocytosis. Probably active.  Also could be due to the prednisone. Check the CBC in the morning, follow the cultures.  No need for antibiotics at this time. 6.  History of gastroesophageal reflux disease and history of hiatal hernia. She is on proton pump inhibitors anyway.   Electronic Signatures: Hortencia Pilar (MD)  (Signed 27-May-14 09:14)  Authored: Health Issues, General Aspect/Present Illness, Home Medications, Allergies, History and Physical Exam, Vital Signs, Labs, Radiology, Impression/Plan   Last Updated: 27-May-14 09:14 by Hortencia Pilar (MD)

## 2015-02-28 ENCOUNTER — Other Ambulatory Visit: Payer: Self-pay | Admitting: Family Medicine

## 2015-03-12 ENCOUNTER — Other Ambulatory Visit: Payer: Self-pay | Admitting: Family Medicine

## 2015-03-16 ENCOUNTER — Other Ambulatory Visit: Payer: Self-pay | Admitting: Family Medicine

## 2015-03-18 ENCOUNTER — Telehealth: Payer: Self-pay | Admitting: *Deleted

## 2015-03-18 NOTE — Telephone Encounter (Signed)
Therapeutic alert from [employer] that fluoxetine 10mg  and haloperidol 0.5mg  can cause prolonged QT interval. Do you want patient to continue both meds or lower dose/frequency?

## 2015-03-18 NOTE — Telephone Encounter (Signed)
Patient's daughter returned Kim's call.  Patient has moved to [facility] and has a new PCP that prescribes her medications.

## 2015-03-18 NOTE — Telephone Encounter (Signed)
ok 

## 2015-03-18 NOTE — Telephone Encounter (Signed)
i did not write for her haldol.   I would defer to whoever is the prescribing physician. I have not seen the patient recently.

## 2015-03-18 NOTE — Telephone Encounter (Signed)
Message left for patient's daughter to return my call.

## 2015-04-12 ENCOUNTER — Telehealth: Payer: Self-pay | Admitting: Family Medicine

## 2015-04-14 NOTE — Telephone Encounter (Signed)
Please call and schedule [employer] [facility] with fasting labs prior with Dr. Lorelei Pont.  Needs appointment in order to keep getting refills on her medications.

## 2015-04-16 NOTE — Telephone Encounter (Signed)
Left message asking pt to call office  °

## 2015-04-16 NOTE — Telephone Encounter (Signed)
Patient's daughter returned Robin's call.  Patient now lives in [place] and has a new PCP.

## 2015-08-06 ENCOUNTER — Encounter: Payer: Self-pay | Admitting: *Deleted

## 2015-10-07 DEATH — deceased
